# Patient Record
Sex: Female | Born: 1965 | Hispanic: No | Marital: Married | Smoking: Never smoker
Health system: Southern US, Community
[De-identification: ages and names within clinical notes are randomized; demographics above are authoritative.]

## PROBLEM LIST (undated history)

## (undated) ENCOUNTER — Ambulatory Visit: Source: Home / Self Care

## (undated) DIAGNOSIS — I10 Essential (primary) hypertension: Secondary | ICD-10-CM

## (undated) DIAGNOSIS — T7840XA Allergy, unspecified, initial encounter: Secondary | ICD-10-CM

## (undated) HISTORY — PX: ABDOMINAL HYSTERECTOMY: SHX81

## (undated) HISTORY — DX: Allergy, unspecified, initial encounter: T78.40XA

---

## 2009-07-12 ENCOUNTER — Encounter (INDEPENDENT_AMBULATORY_CARE_PROVIDER_SITE_OTHER): Payer: Self-pay | Admitting: Obstetrics & Gynecology

## 2009-07-12 ENCOUNTER — Inpatient Hospital Stay (HOSPITAL_COMMUNITY): Admission: RE | Admit: 2009-07-12 | Discharge: 2009-07-14 | Payer: Self-pay | Admitting: Obstetrics & Gynecology

## 2009-12-09 ENCOUNTER — Emergency Department (HOSPITAL_COMMUNITY): Admission: EM | Admit: 2009-12-09 | Discharge: 2009-12-10 | Payer: Self-pay | Admitting: Emergency Medicine

## 2010-06-27 LAB — CBC
Hemoglobin: 10.4 g/dL — ABNORMAL LOW (ref 12.0–15.0)
Hemoglobin: 13.1 g/dL (ref 12.0–15.0)
MCHC: 32.9 g/dL (ref 30.0–36.0)
MCV: 87.1 fL (ref 78.0–100.0)
RBC: 3.64 MIL/uL — ABNORMAL LOW (ref 3.87–5.11)
RBC: 4.59 MIL/uL (ref 3.87–5.11)
WBC: 7.1 10*3/uL (ref 4.0–10.5)

## 2010-06-27 LAB — PREGNANCY, URINE: Preg Test, Ur: NEGATIVE

## 2010-10-14 ENCOUNTER — Emergency Department (HOSPITAL_COMMUNITY)
Admission: EM | Admit: 2010-10-14 | Discharge: 2010-10-14 | Disposition: A | Discharge status: Home / Self Care | Payer: Federal, State, Local not specified - PPO | Attending: Emergency Medicine | Admitting: Emergency Medicine

## 2010-10-14 DIAGNOSIS — R220 Localized swelling, mass and lump, head: Secondary | ICD-10-CM | POA: Insufficient documentation

## 2010-10-14 DIAGNOSIS — X58XXXA Exposure to other specified factors, initial encounter: Secondary | ICD-10-CM | POA: Insufficient documentation

## 2010-10-14 DIAGNOSIS — T781XXA Other adverse food reactions, not elsewhere classified, initial encounter: Secondary | ICD-10-CM | POA: Insufficient documentation

## 2013-12-24 LAB — TSH
TSH: 2.64 u[IU]/mL (ref 0.41–5.90)
TSH: 2.64 u[IU]/mL (ref 0.41–5.90)

## 2014-01-07 ENCOUNTER — Ambulatory Visit: Payer: Federal, State, Local not specified - PPO | Admitting: Internal Medicine

## 2014-01-07 DIAGNOSIS — Z0289 Encounter for other administrative examinations: Secondary | ICD-10-CM

## 2014-01-14 ENCOUNTER — Ambulatory Visit: Payer: Federal, State, Local not specified - PPO | Admitting: Internal Medicine

## 2014-03-02 ENCOUNTER — Ambulatory Visit: Payer: Federal, State, Local not specified - PPO | Admitting: Internal Medicine

## 2014-06-17 ENCOUNTER — Emergency Department (HOSPITAL_COMMUNITY)
Admission: EM | Admit: 2014-06-17 | Discharge: 2014-06-17 | Disposition: A | Discharge status: Home / Self Care | Payer: Federal, State, Local not specified - PPO | Attending: Emergency Medicine | Admitting: Emergency Medicine

## 2014-06-17 ENCOUNTER — Encounter (HOSPITAL_COMMUNITY): Payer: Self-pay | Admitting: Emergency Medicine

## 2014-06-17 DIAGNOSIS — Y9389 Activity, other specified: Secondary | ICD-10-CM | POA: Diagnosis not present

## 2014-06-17 DIAGNOSIS — R51 Headache: Secondary | ICD-10-CM

## 2014-06-17 DIAGNOSIS — I10 Essential (primary) hypertension: Secondary | ICD-10-CM | POA: Diagnosis not present

## 2014-06-17 DIAGNOSIS — S0990XA Unspecified injury of head, initial encounter: Secondary | ICD-10-CM | POA: Insufficient documentation

## 2014-06-17 DIAGNOSIS — R519 Headache, unspecified: Secondary | ICD-10-CM

## 2014-06-17 DIAGNOSIS — S79922A Unspecified injury of left thigh, initial encounter: Secondary | ICD-10-CM | POA: Diagnosis not present

## 2014-06-17 DIAGNOSIS — S4992XA Unspecified injury of left shoulder and upper arm, initial encounter: Secondary | ICD-10-CM | POA: Insufficient documentation

## 2014-06-17 DIAGNOSIS — Y92410 Unspecified street and highway as the place of occurrence of the external cause: Secondary | ICD-10-CM | POA: Diagnosis not present

## 2014-06-17 DIAGNOSIS — Y998 Other external cause status: Secondary | ICD-10-CM | POA: Insufficient documentation

## 2014-06-17 HISTORY — DX: Essential (primary) hypertension: I10

## 2014-06-17 MED ORDER — IBUPROFEN 800 MG PO TABS
800.0000 mg | ORAL_TABLET | Freq: Once | ORAL | Status: AC
  Administered 2014-06-17: 800 mg via ORAL
  Filled 2014-06-17: qty 1

## 2014-06-17 MED ORDER — CYCLOBENZAPRINE HCL 10 MG PO TABS: 10.0000 mg | ORAL_TABLET | Freq: Three times a day (TID) | ORAL | Status: DC | PRN

## 2014-06-17 MED ORDER — AMLODIPINE BESYLATE 5 MG PO TABS: 5.0000 mg | ORAL_TABLET | Freq: Every day | ORAL | Status: DC

## 2014-06-17 MED ORDER — HYDROCODONE-ACETAMINOPHEN 5-325 MG PO TABS: 1.0000 | ORAL_TABLET | Freq: Four times a day (QID) | ORAL | Status: DC | PRN
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
## 2014-06-24 ENCOUNTER — Encounter: Payer: Self-pay | Admitting: Family

## 2014-06-24 ENCOUNTER — Ambulatory Visit (INDEPENDENT_AMBULATORY_CARE_PROVIDER_SITE_OTHER): Payer: Federal, State, Local not specified - PPO | Admitting: Family

## 2014-06-24 VITALS — BP 138/94 | HR 88 | Temp 98.4°F | Resp 18 | Ht 63.0 in | Wt 215.0 lb

## 2014-06-24 DIAGNOSIS — E1159 Type 2 diabetes mellitus with other circulatory complications: Secondary | ICD-10-CM | POA: Insufficient documentation

## 2014-06-24 DIAGNOSIS — I152 Hypertension secondary to endocrine disorders: Secondary | ICD-10-CM | POA: Insufficient documentation

## 2014-06-24 DIAGNOSIS — I10 Essential (primary) hypertension: Secondary | ICD-10-CM | POA: Insufficient documentation

## 2014-06-24 MED ORDER — AMLODIPINE BESYLATE 10 MG PO TABS: 10.0000 mg | ORAL_TABLET | Freq: Every day | ORAL | Status: DC

## 2014-06-24 MED ORDER — HYDROCHLOROTHIAZIDE 12.5 MG PO CAPS
12.5000 mg | ORAL_CAPSULE | Freq: Every day | ORAL | Status: DC
Start: 2014-06-24 — End: 2014-08-22

## 2014-06-24 NOTE — Patient Instructions (Addendum)
Thank you for choosing [facility].  Summary/Instructions:  Your prescription(s) have been submitted to your pharmacy or been printed and provided for you. Please take as directed and contact our office if you believe you are having problem(s) with the medication(s) or have any questions.  If your symptoms worsen or fail to improve, please contact our office for further instruction, or in case of emergency go directly to the emergency room at the closest medical facility.   Hypertension Hypertension, commonly called high blood pressure, is when the force of blood pumping through your arteries is too strong. Your arteries are the blood vessels that carry blood from your heart throughout your body. A blood pressure reading consists of a higher number over a lower number, such as 110/72. The higher number (systolic) is the pressure inside your arteries when your heart pumps. The lower number (diastolic) is the pressure inside your arteries when your heart relaxes. Ideally you want your blood pressure below 120/80. Hypertension forces your heart to work harder to pump blood. Your arteries may become narrow or stiff. Having hypertension puts you at risk for heart disease, stroke, and other problems.  RISK FACTORS Some risk factors for high blood pressure are controllable. Others are not.  Risk factors you cannot control include:   Race. You may be at higher risk if you are African American.  Age. Risk increases with age.  Gender. Men are at higher risk than women before age 45 years. After age 65, women are at higher risk than men. Risk factors you can control include:  Not getting enough exercise or physical activity.  Being overweight.  Getting too much fat, sugar, calories, or salt in your diet.  Drinking too much alcohol. SIGNS AND SYMPTOMS Hypertension does not usually cause signs or symptoms. Extremely high blood pressure (hypertensive crisis) may cause headache, anxiety,  shortness of breath, and nosebleed. DIAGNOSIS  To check if you have hypertension, your health care provider will measure your blood pressure while you are seated, with your arm held at the level of your heart. It should be measured at least twice using the same arm. Certain conditions can cause a difference in blood pressure between your right and left arms. A blood pressure reading that is higher than normal on one occasion does not mean that you need treatment. If one blood pressure reading is high, ask your health care provider about having it checked again. TREATMENT  Treating high blood pressure includes making lifestyle changes and possibly taking medicine. Living a healthy lifestyle can help lower high blood pressure. You may need to change some of your habits. Lifestyle changes may include:  Following the DASH diet. This diet is high in fruits, vegetables, and whole grains. It is low in salt, red meat, and added sugars.  Getting at least 2 hours of brisk physical activity every week.  Losing weight if necessary.  Not smoking.  Limiting alcoholic beverages.  Learning ways to reduce stress. If lifestyle changes are not enough to get your blood pressure under control, your health care provider may prescribe medicine. You may need to take more than one. Work closely with your health care provider to understand the risks and benefits. HOME CARE INSTRUCTIONS  Have your blood pressure rechecked as directed by your health care provider.   Take medicines only as directed by your health care provider. Follow the directions carefully. Blood pressure medicines must be taken as prescribed. The medicine does not work as well when you skip doses.   Skipping doses also puts you at risk for problems.   Do not smoke.   Monitor your blood pressure at home as directed by your health care provider. SEEK MEDICAL CARE IF:   You think you are having a reaction to medicines taken.  You have  recurrent headaches or feel dizzy.  You have swelling in your ankles.  You have trouble with your vision. SEEK IMMEDIATE MEDICAL CARE IF:  You develop a severe headache or confusion.  You have unusual weakness, numbness, or feel faint.  You have severe chest or abdominal pain.  You vomit repeatedly.  You have trouble breathing. MAKE SURE YOU:   Understand these instructions.  Will watch your condition.  Will get help right away if you are not doing well or get worse. Document Released: 03/25/2005 Document Revised: 08/09/2013 Document Reviewed: 01/15/2013 ExitCare Patient Information 2015 [employer]. This information is not intended to replace advice given to you by your health care provider. Make sure you discuss any questions you have with your health care provider.   

## 2014-06-24 NOTE — Progress Notes (Signed)
Subjective:    Patient ID: Bailey Ward, female    DOB: 10/22/1965, 49 y.o.   MRN: [id number]  Chief Complaint  Patient presents with  . Establish Care    wants to talk about BP medication    HPI:  Bailey Ward is a 49 y.o. female who presents today to establish care and discuss her blood pressure medication.     1)  Blood pressure - currently maintained on amlodipine. Notes that she was having the associated symptom of a headache and was found to have a blood pressure at work of 197/96.  Notes that she was given a 30 day supply and was taking 2 amlodipine when she was prescribed 5 mg daily. She is almost out of her current medication.    BP Readings from Last 3 Encounters:  06/24/14 138/94  06/17/14 149/98    No Known Allergies  Current Outpatient Prescriptions on File Prior to Visit  Medication Sig Dispense Refill  . cetirizine (ZYRTEC) 10 MG tablet Take 10 mg by mouth daily as needed for allergies (only in allergy season).    . cyclobenzaprine (FLEXERIL) 10 MG tablet Take 1 tablet (10 mg total) by mouth 3 (three) times daily as needed for muscle spasms. 6 tablet 0  . HYDROcodone-acetaminophen (NORCO) 5-325 MG per tablet Take 1 tablet by mouth every 6 (six) hours as needed. 10 tablet 0  . ibuprofen (ADVIL,MOTRIN) 200 MG tablet Take 400 mg by mouth every 6 (six) hours as needed for headache or moderate pain.     No current facility-administered medications on file prior to visit.    Past Medical History  Diagnosis Date  . Hypertension     Past Surgical History  Procedure Laterality Date  . Abdominal hysterectomy      Family History  Problem Relation Age of Onset  . Hypertension Mother   . Diabetes Father   . Hypertension Father     History   Social History  . Marital Status: Married    Spouse Name: N/A  . Number of Children: 2  . Years of Education: 18   Occupational History  . Annuity Specialist    Social History Main Topics  .  Smoking status: Never Smoker   . Smokeless tobacco: Never Used  . Alcohol Use: No  . Drug Use: No  . Sexual Activity: Not on file   Other Topics Concern  . Not on file   Social History Narrative   [place] and raised in [place]. Fun: [place], watch her daughter play soccer, watch movies   Denies religious beliefs that would effect health care.      Review of Systems  Eyes:       Negative for changes in vision.  Respiratory: Negative for chest tightness.   Cardiovascular: Negative for chest pain, palpitations and leg swelling.  Neurological: Negative for headaches.      Objective:    BP 138/94 mmHg  Pulse 88  Temp(Src) 98.4 F (36.9 C) (Oral)  Resp 18  Ht 5\' 3"  (1.6 m)  Wt 215 lb (97.523 kg)  BMI 38.09 kg/m2  SpO2 98% Nursing note and vital signs reviewed.  Physical Exam  Constitutional: She is oriented to person, place, and time. She appears well-developed and well-nourished. No distress.  Cardiovascular: Normal rate, regular rhythm, normal heart sounds and intact distal pulses.   Mild edema of bilateral lower extremities noted.   Pulmonary/Chest: Effort normal and breath sounds normal.  Neurological: She is alert and oriented to person, place, and time.  Skin: Skin is warm and dry.  Psychiatric: She has a normal mood and affect. Her behavior is normal. Judgment and thought content normal.       Assessment & Plan:

## 2014-06-24 NOTE — Progress Notes (Signed)
Pre visit review using our clinic review tool, if applicable. No additional management support is needed unless otherwise documented below in the visit note. 

## 2014-06-24 NOTE — Assessment & Plan Note (Signed)
Patient's blood pressure is greater than goal of 140/90. Increase amlodipine to 10 mg daily. Start hydrochlorothiazide daily for lower leg edema. Patient will schedule her eye exam independently. Follow-up in one month.

## 2014-07-05 ENCOUNTER — Ambulatory Visit: Payer: Federal, State, Local not specified - PPO | Admitting: Family

## 2014-07-11 ENCOUNTER — Ambulatory Visit: Payer: Federal, State, Local not specified - PPO | Admitting: Family

## 2014-08-05 ENCOUNTER — Ambulatory Visit: Payer: Federal, State, Local not specified - PPO | Admitting: Family

## 2014-08-08 ENCOUNTER — Ambulatory Visit: Payer: Federal, State, Local not specified - PPO | Admitting: Family

## 2014-08-22 ENCOUNTER — Telehealth: Payer: Self-pay | Admitting: Family

## 2014-08-22 MED ORDER — AMLODIPINE BESYLATE 10 MG PO TABS: 10.0000 mg | ORAL_TABLET | Freq: Every day | ORAL | Status: DC

## 2014-08-22 MED ORDER — HYDROCHLOROTHIAZIDE 12.5 MG PO CAPS: 12.5000 mg | ORAL_CAPSULE | Freq: Every day | ORAL | Status: DC

## 2014-08-22 NOTE — Telephone Encounter (Signed)
Patient is requesting refill for amLODipine (NORVASC) 10 MG tablet [[id number]][[id number]]  And hydrochlorothiazide (MICROZIDE) 12.5 MG capsule [[id number]][[id number]] . Pharmacy is [facility] on [address]

## 2014-08-22 NOTE — Telephone Encounter (Signed)
Rx's sent. Pt aware

## 2014-09-08 ENCOUNTER — Ambulatory Visit (INDEPENDENT_AMBULATORY_CARE_PROVIDER_SITE_OTHER): Payer: Federal, State, Local not specified - PPO | Admitting: Family

## 2014-09-08 ENCOUNTER — Encounter: Payer: Self-pay | Admitting: Family

## 2014-09-08 VITALS — BP 122/80 | HR 88 | Temp 98.1°F | Resp 18 | Ht 63.0 in | Wt 220.8 lb

## 2014-09-08 DIAGNOSIS — I10 Essential (primary) hypertension: Secondary | ICD-10-CM | POA: Diagnosis not present

## 2014-09-08 DIAGNOSIS — L659 Nonscarring hair loss, unspecified: Secondary | ICD-10-CM | POA: Diagnosis not present

## 2014-09-08 MED ORDER — FUROSEMIDE 20 MG PO TABS: 20.0000 mg | ORAL_TABLET | Freq: Every day | ORAL | Status: DC

## 2014-09-08 NOTE — Patient Instructions (Addendum)
Thank you for choosing [facility].  Summary/Instructions:  Your prescription(s) have been submitted to your pharmacy or been printed and provided for you. Please take as directed and contact our office if you believe you are having problem(s) with the medication(s) or have any questions.  If your symptoms worsen or fail to improve, please contact our office for further instruction, or in case of emergency go directly to the emergency room at the closest medical facility.    Please stop taking the amlodipine and HCTZ. START taking furosemide daily.  Call in 2 weeks with your blood pressure.

## 2014-09-08 NOTE — Assessment & Plan Note (Signed)
Stable with current regimen and blood pressure average below 140/90. Does not continued lower extremity edema. Discontinue amlodipine and HCTZ. Start Lasix. Follow up via phone in 2 weeks and if blood pressure elevates, consider addition of metoprolol for blood pressure control.

## 2014-09-08 NOTE — Progress Notes (Signed)
Subjective:    Patient ID: Bailey Ward, female    DOB: [birth year], 49 y.o.   MRN: [id number]  Chief Complaint  Patient presents with  . Follow-up    says the BP has been up and down but for the most part stable, says the HCTZ is not helping with swelling    HPI:  Bailey Ward is a 49 y.o. female with a PMH of hypertension who presents today for an office follow-up.   1.) Hypertension - Indicates her blood pressure is stable with the current medication and has readings that are averaging below 140/90. Currently taking HCTZ and amlodipine. Takes her medication as prescribed, however still notes lower extremity edema which is improved with leg elevation.   BP Readings from Last 3 Encounters:  09/08/14 122/80  06/24/14 138/94  06/17/14 149/98    2.) Hair loss - Indicates that since her hysterectomy she has noticed increased hair loss on the top of her head. Modifying factors include biotin and Vitamin B which has not helped very much.   No Known Allergies  Current Outpatient Prescriptions on File Prior to Visit  Medication Sig Dispense Refill  . cetirizine (ZYRTEC) 10 MG tablet Take 10 mg by mouth daily as needed for allergies (only in allergy season).    Marland Kitchen ibuprofen (ADVIL,MOTRIN) 200 MG tablet Take 400 mg by mouth every 6 (six) hours as needed for headache or moderate pain.     No current facility-administered medications on file prior to visit.    Review of Systems  Eyes:       Negative for changes in vision.  Respiratory: Negative for chest tightness.   Cardiovascular: Positive for leg swelling. Negative for chest pain and palpitations.  Skin:       Positive for hair loss on the top of her head      Objective:    BP 122/80 mmHg  Pulse 88  Temp(Src) 98.1 F (36.7 C) (Oral)  Resp 18  Ht  (1.6 m)  Wt 220 lb 12.8 oz (100.154 kg)  BMI 39.12 kg/m2  SpO2 94% Nursing note and vital signs reviewed.  Physical Exam  Constitutional: She is  oriented to person, place, and time. She appears well-developed and well-nourished. No distress.  Cardiovascular: Normal rate, regular rhythm, normal heart sounds and intact distal pulses.   Pulmonary/Chest: Effort normal and breath sounds normal.  Neurological: She is alert and oriented to person, place, and time.  Skin: Skin is warm and dry.  Psychiatric: She has a normal mood and affect. Her behavior is normal. Judgment and thought content normal.       Assessment & Plan:   Problem List Items Addressed This Visit      Cardiovascular and Mediastinum   Essential hypertension - Primary    Stable with current regimen and blood pressure average below 140/90. Does not continued lower extremity edema. Discontinue amlodipine and HCTZ. Start Lasix. Follow up via phone in 2 weeks and if blood pressure elevates, consider addition of metoprolol for blood pressure control.      Relevant Medications   furosemide (LASIX) 20 MG tablet     Musculoskeletal and Integument   Hair loss    Indicates that she has mild hair loss on the top of her head only since her hysterectomy that is refractory to biotin and vitamin B supplementation. Obtain TSH and iron to rule out metabolic causes, cannot rule out hormonal causes. Follow up pending lab work.  Relevant Orders   TSH   IBC panel

## 2014-09-08 NOTE — Assessment & Plan Note (Signed)
Indicates that she has mild hair loss on the top of her head only since her hysterectomy that is refractory to biotin and vitamin B supplementation. Obtain TSH and iron to rule out metabolic causes, cannot rule out hormonal causes. Follow up pending lab work.

## 2014-09-08 NOTE — Progress Notes (Signed)
Pre visit review using our clinic review tool, if applicable. No additional management support is needed unless otherwise documented below in the visit note. 

## 2015-05-25 ENCOUNTER — Encounter: Payer: Self-pay | Admitting: Family

## 2015-06-26 ENCOUNTER — Encounter: Payer: Federal, State, Local not specified - PPO | Admitting: Family

## 2015-08-01 ENCOUNTER — Encounter: Payer: Self-pay | Admitting: Family

## 2015-08-01 ENCOUNTER — Ambulatory Visit (INDEPENDENT_AMBULATORY_CARE_PROVIDER_SITE_OTHER): Payer: Federal, State, Local not specified - PPO | Admitting: Family

## 2015-08-01 VITALS — BP 158/100 | HR 94 | Temp 98.5°F | Resp 16 | Ht 63.0 in | Wt 217.0 lb

## 2015-08-01 DIAGNOSIS — E669 Obesity, unspecified: Secondary | ICD-10-CM

## 2015-08-01 DIAGNOSIS — Z0001 Encounter for general adult medical examination with abnormal findings: Secondary | ICD-10-CM | POA: Insufficient documentation

## 2015-08-01 DIAGNOSIS — I10 Essential (primary) hypertension: Secondary | ICD-10-CM | POA: Diagnosis not present

## 2015-08-01 DIAGNOSIS — R6889 Other general symptoms and signs: Secondary | ICD-10-CM | POA: Diagnosis not present

## 2015-08-01 MED ORDER — FUROSEMIDE 40 MG PO TABS: 40.0000 mg | ORAL_TABLET | Freq: Every day | ORAL | Status: DC

## 2015-08-01 NOTE — Patient Instructions (Signed)
Thank you for choosing [facility].  Summary/Instructions:  Please stop by the lab on the basement level of the building for your blood work. Your results will be released to MyChart (or called to you) after review, usually within 72 hours after test completion. If any changes need to be made, you will be notified at that same time.  If your symptoms worsen or fail to improve, please contact our office for further instruction, or in case of emergency go directly to the emergency room at the closest medical facility.   Health Maintenance, Female Adopting a healthy lifestyle and getting preventive care can go a long way to promote health and wellness. Talk with your health care provider about what schedule of regular examinations is right for you. This is a good chance for you to check in with your provider about disease prevention and staying healthy. In between checkups, there are plenty of things you can do on your own. Experts have done a lot of research about which lifestyle changes and preventive measures are most likely to keep you healthy. Ask your health care provider for more information. WEIGHT AND DIET  Eat a healthy diet  Be sure to include plenty of vegetables, fruits, low-fat dairy products, and lean protein.  Do not eat a lot of foods high in solid fats, added sugars, or salt.  Get regular exercise. This is one of the most important things you can do for your health.  Most adults should exercise for at least 150 minutes each week. The exercise should increase your heart rate and make you sweat (moderate-intensity exercise).  Most adults should also do strengthening exercises at least twice a week. This is in addition to the moderate-intensity exercise.  Maintain a healthy weight  Body mass index (BMI) is a measurement that can be used to identify possible weight problems. It estimates body fat based on height and weight. Your health care provider can help determine your  BMI and help you achieve or maintain a healthy weight.  For females 20 years of age and older:   A BMI below 18.5 is considered underweight.  A BMI of 18.5 to 24.9 is normal.  A BMI of 25 to 29.9 is considered overweight.  A BMI of 30 and above is considered obese.  Watch levels of cholesterol and blood lipids  You should start having your blood tested for lipids and cholesterol at 50 years of age, then have this test every 5 years.  You may need to have your cholesterol levels checked more often if:  Your lipid or cholesterol levels are high.  You are older than 50 years of age.  You are at high risk for heart disease.  CANCER SCREENING   Lung Cancer  Lung cancer screening is recommended for adults 55-80 years old who are at high risk for lung cancer because of a history of smoking.  A yearly low-dose CT scan of the lungs is recommended for people who:  Currently smoke.  Have quit within the past 15 years.  Have at least a 30-pack-year history of smoking. A pack year is smoking an average of one pack of cigarettes a day for 1 year.  Yearly screening should continue until it has been 15 years since you quit.  Yearly screening should stop if you develop a health problem that would prevent you from having lung cancer treatment.  Breast Cancer  Practice breast self-awareness. This means understanding how your breasts normally appear and feel.  It   also means doing regular breast self-exams. Let your health care provider know about any changes, no matter how small.  If you are in your 20s or 30s, you should have a clinical breast exam (CBE) by a health care provider every 1-3 years as part of a regular health exam.  If you are 40 or older, have a CBE every year. Also consider having a breast X-ray (mammogram) every year.  If you have a family history of breast cancer, talk to your health care provider about genetic screening.  If you are at high risk for breast  cancer, talk to your health care provider about having an MRI and a mammogram every year.  Breast cancer gene (BRCA) assessment is recommended for women who have family members with BRCA-related cancers. BRCA-related cancers include:  Breast.  Ovarian.  Tubal.  Peritoneal cancers.  Results of the assessment will determine the need for genetic counseling and BRCA1 and BRCA2 testing. Cervical Cancer Your health care provider may recommend that you be screened regularly for cancer of the pelvic organs (ovaries, uterus, and vagina). This screening involves a pelvic examination, including checking for microscopic changes to the surface of your cervix (Pap test). You may be encouraged to have this screening done every 3 years, beginning at age 21.  For women ages 30-65, health care providers may recommend pelvic exams and Pap testing every 3 years, or they may recommend the Pap and pelvic exam, combined with testing for human papilloma virus (HPV), every 5 years. Some types of HPV increase your risk of cervical cancer. Testing for HPV may also be done on women of any age with unclear Pap test results.  Other health care providers may not recommend any screening for nonpregnant women who are considered low risk for pelvic cancer and who do not have symptoms. Ask your health care provider if a screening pelvic exam is right for you.  If you have had past treatment for cervical cancer or a condition that could lead to cancer, you need Pap tests and screening for cancer for at least 20 years after your treatment. If Pap tests have been discontinued, your risk factors (such as having a new sexual partner) need to be reassessed to determine if screening should resume. Some women have medical problems that increase the chance of getting cervical cancer. In these cases, your health care provider may recommend more frequent screening and Pap tests. Colorectal Cancer  This type of cancer can be detected and  often prevented.  Routine colorectal cancer screening usually begins at 50 years of age and continues through 50 years of age.  Your health care provider may recommend screening at an earlier age if you have risk factors for colon cancer.  Your health care provider may also recommend using home test kits to check for hidden blood in the stool.  A small camera at the end of a tube can be used to examine your colon directly (sigmoidoscopy or colonoscopy). This is done to check for the earliest forms of colorectal cancer.  Routine screening usually begins at age 50.  Direct examination of the colon should be repeated every 5-10 years through 50 years of age. However, you may need to be screened more often if early forms of precancerous polyps or small growths are found. Skin Cancer  Check your skin from head to toe regularly.  Tell your health care provider about any new moles or changes in moles, especially if there is a change in a mole's   shape or color.  Also tell your health care provider if you have a mole that is larger than the size of a pencil eraser.  Always use sunscreen. Apply sunscreen liberally and repeatedly throughout the day.  Protect yourself by wearing long sleeves, pants, a wide-brimmed hat, and sunglasses whenever you are outside. HEART DISEASE, DIABETES, AND HIGH BLOOD PRESSURE   High blood pressure causes heart disease and increases the risk of stroke. High blood pressure is more likely to develop in:  People who have blood pressure in the high end of the normal range (130-139/85-89 mm Hg).  People who are overweight or obese.  People who are African American.  If you are 18-39 years of age, have your blood pressure checked every 3-5 years. If you are 40 years of age or older, have your blood pressure checked every year. You should have your blood pressure measured twice--once when you are at a hospital or clinic, and once when you are not at a hospital or clinic.  Record the average of the two measurements. To check your blood pressure when you are not at a hospital or clinic, you can use:  An automated blood pressure machine at a pharmacy.  A home blood pressure monitor.  If you are between 55 years and 79 years old, ask your health care provider if you should take aspirin to prevent strokes.  Have regular diabetes screenings. This involves taking a blood sample to check your fasting blood sugar level.  If you are at a normal weight and have a low risk for diabetes, have this test once every three years after 50 years of age.  If you are overweight and have a high risk for diabetes, consider being tested at a younger age or more often. PREVENTING INFECTION  Hepatitis B  If you have a higher risk for hepatitis B, you should be screened for this virus. You are considered at high risk for hepatitis B if:  You were born in a country where hepatitis B is common. Ask your health care provider which countries are considered high risk.  Your parents were born in a high-risk country, and you have not been immunized against hepatitis B (hepatitis B vaccine).  You have HIV or AIDS.  You use needles to inject street drugs.  You live with someone who has hepatitis B.  You have had sex with someone who has hepatitis B.  You get hemodialysis treatment.  You take certain medicines for conditions, including cancer, organ transplantation, and autoimmune conditions. Hepatitis C  Blood testing is recommended for:  Everyone born from 1945 through 1965.  Anyone with known risk factors for hepatitis C. Sexually transmitted infections (STIs)  You should be screened for sexually transmitted infections (STIs) including gonorrhea and chlamydia if:  You are sexually active and are younger than 50 years of age.  You are older than 50 years of age and your health care provider tells you that you are at risk for this type of infection.  Your sexual activity  has changed since you were last screened and you are at an increased risk for chlamydia or gonorrhea. Ask your health care provider if you are at risk.  If you do not have HIV, but are at risk, it may be recommended that you take a prescription medicine daily to prevent HIV infection. This is called pre-exposure prophylaxis (PrEP). You are considered at risk if:  You are sexually active and do not regularly use condoms or know the   HIV status of your partner(s).  You take drugs by injection.  You are sexually active with a partner who has HIV. Talk with your health care provider about whether you are at high risk of being infected with HIV. If you choose to begin PrEP, you should first be tested for HIV. You should then be tested every 3 months for as long as you are taking PrEP.  PREGNANCY   If you are premenopausal and you may become pregnant, ask your health care provider about preconception counseling.  If you may become pregnant, take 400 to 800 micrograms (mcg) of folic acid every day.  If you want to prevent pregnancy, talk to your health care provider about birth control (contraception). OSTEOPOROSIS AND MENOPAUSE   Osteoporosis is a disease in which the bones lose minerals and strength with aging. This can result in serious bone fractures. Your risk for osteoporosis can be identified using a bone density scan.  If you are 65 years of age or older, or if you are at risk for osteoporosis and fractures, ask your health care provider if you should be screened.  Ask your health care provider whether you should take a calcium or vitamin D supplement to lower your risk for osteoporosis.  Menopause may have certain physical symptoms and risks.  Hormone replacement therapy may reduce some of these symptoms and risks. Talk to your health care provider about whether hormone replacement therapy is right for you.  HOME CARE INSTRUCTIONS   Schedule regular health, dental, and eye  exams.  Stay current with your immunizations.   Do not use any tobacco products including cigarettes, chewing tobacco, or electronic cigarettes.  If you are pregnant, do not drink alcohol.  If you are breastfeeding, limit how much and how often you drink alcohol.  Limit alcohol intake to no more than 1 drink per day for nonpregnant women. One drink equals 12 ounces of beer, 5 ounces of wine, or 1 ounces of hard liquor.  Do not use street drugs.  Do not share needles.  Ask your health care provider for help if you need support or information about quitting drugs.  Tell your health care provider if you often feel depressed.  Tell your health care provider if you have ever been abused or do not feel safe at home.   This information is not intended to replace advice given to you by your health care provider. Make sure you discuss any questions you have with your health care provider.   Document Released: 10/08/2010 Document Revised: 04/15/2014 Document Reviewed: 02/24/2013 Elsevier Interactive Patient Education 2016 [employer] Inc[employer]   

## 2015-08-01 NOTE — Assessment & Plan Note (Signed)
Hypertension is uncontrolled and above goal 140/90 on lifestyle management. No symptoms of end organ damage with mild lower extremity edema noted. Start furosemide. Encouraged to monitor blood pressures at home. Follow-up in 3 weeks or sooner if needed.

## 2015-08-01 NOTE — Assessment & Plan Note (Signed)
BMI of 38 and overly sedentary lifestyle. Encouraged weight loss of 5-10% of current body weight through lifestyle changes. Recommend increasing physical activity to 30 minutes of moderate level activity daily. Encourage nutritional intake that focuses on nutrient dense foods and is moderate, varied, and balanced and is low in saturated fats and processed/sugary foods. Continue to monitor.

## 2015-08-01 NOTE — Progress Notes (Signed)
Pre visit review using our clinic review tool, if applicable. No additional management support is needed unless otherwise documented below in the visit note. 

## 2015-08-01 NOTE — Progress Notes (Signed)
Subjective:    Patient ID: Bailey Ward, female    DOB: 10/16/1965, 50 y.o.   MRN: [id number]  Chief Complaint  Patient presents with  . CPE    not fasting    HPI:  Bailey Ward is a 50 y.o. female who presents today for an annual wellness visit.   1) Health Maintenance -   Diet - Averages 3-4 meals per day consisting of chicken, fish, beef, pork, starches, fruits, and vegetables; Has decreased caffeine intake to about 2-3 cups daily  Exercise - No recent structured exercise; some walking   2) Preventative Exams / Immunizations:  Dental -- Due for exam  Vision -- Due for exam with appointment scheduled.    Health Maintenance  Topic Date Due  . HIV Screening  01/10/1981  . INFLUENZA VACCINE  11/07/2015  . PAP SMEAR  03/22/2018  . TETANUS/TDAP  08/10/2020     There is no immunization history on file for this patient.  No Known Allergies   Outpatient Prescriptions Prior to Visit  Medication Sig Dispense Refill  . cetirizine (ZYRTEC) 10 MG tablet Take 10 mg by mouth daily as needed for allergies (only in allergy season).    Marland Kitchen ibuprofen (ADVIL,MOTRIN) 200 MG tablet Take 400 mg by mouth every 6 (six) hours as needed for headache or moderate pain.    . furosemide (LASIX) 20 MG tablet Take 1 tablet (20 mg total) by mouth daily. 30 tablet 0   No facility-administered medications prior to visit.     Past Medical History  Diagnosis Date  . Hypertension   . Allergy      Past Surgical History  Procedure Laterality Date  . Abdominal hysterectomy       Family History  Problem Relation Age of Onset  . Hypertension Mother   . Diabetes Father   . Hypertension Father      Social History   Social History  . Marital Status: Married    Spouse Name: N/A  . Number of Children: 2  . Years of Education: 18   Occupational History  . Annuity Specialist    Social History Main Topics  . Smoking status: Never Smoker   . Smokeless tobacco:  Never Used  . Alcohol Use: No  . Drug Use: No  . Sexual Activity: Not on file   Other Topics Concern  . Not on file   Social History Narrative   Born [place], [place] and raised in [place]. Fun: Travel, go to [place], watch her daughter play soccer, watch movies   Denies religious beliefs that would effect health care.    Denies abuse and feels safe at home.      Review of Systems  Constitutional: Denies fever, chills, fatigue, or significant weight gain/loss. HENT: Head: Denies headache or neck pain Ears: Denies changes in hearing, ringing in ears, earache, drainage Nose: Denies discharge, stuffiness, itching, nosebleed, sinus pain Throat: Denies sore throat, hoarseness, dry mouth, sores, thrush Eyes: Denies loss/changes in vision, pain, redness, blurry/double vision, flashing lights Cardiovascular: Denies chest pain/discomfort, tightness, palpitations, shortness of breath with activity, difficulty lying down, swelling, sudden awakening with shortness of breath Respiratory: Denies shortness of breath, cough, sputum production, wheezing Gastrointestinal: Denies dysphasia, heartburn, change in appetite, nausea, change in bowel habits, rectal bleeding, constipation, diarrhea, yellow skin or eyes Genitourinary: Denies frequency, urgency, burning/pain, blood in urine, incontinence, change in urinary strength. Musculoskeletal: Denies muscle/joint pain, stiffness, back pain, redness or swelling of joints, trauma Skin: Denies  rashes, lumps, itching, dryness, color changes, or hair/nail changes Neurological: Denies dizziness, fainting, seizures, weakness, numbness, tingling, tremor Psychiatric - Denies nervousness, stress, depression or memory loss Endocrine: Denies heat or cold intolerance, sweating, frequent urination, excessive thirst, changes in appetite Hematologic: Denies ease of bruising or bleeding     Objective:     BP 158/100 mmHg  Pulse 94  Temp(Src) 98.5 F (36.9 C) (Oral)   Resp 16  Ht  (1.6 m)  Wt 217 lb (98.431 kg)  BMI 38.45 kg/m2  SpO2 97% Nursing note and vital signs reviewed.  Physical Exam  Constitutional: She is oriented to person, place, and time. She appears well-developed and well-nourished.  HENT:  Head: Normocephalic.  Right Ear: Hearing, tympanic membrane, external ear and ear canal normal.  Left Ear: Hearing, tympanic membrane, external ear and ear canal normal.  Nose: Nose normal.  Mouth/Throat: Uvula is midline, oropharynx is clear and moist and mucous membranes are normal.  Eyes: Conjunctivae and EOM are normal. Pupils are equal, round, and reactive to light.  Neck: Neck supple. No JVD present. No tracheal deviation present. No thyromegaly present.  Cardiovascular: Normal rate, regular rhythm, normal heart sounds and intact distal pulses.   Mild/moderate nonpitting edema noted bilaterally.  Pulmonary/Chest: Effort normal and breath sounds normal.  Abdominal: Soft. Bowel sounds are normal. She exhibits no distension and no mass. There is no tenderness. There is no rebound and no guarding.  Musculoskeletal: Normal range of motion. She exhibits no edema or tenderness.  Lymphadenopathy:    She has no cervical adenopathy.  Neurological: She is alert and oriented to person, place, and time. She has normal reflexes. No cranial nerve deficit. She exhibits normal muscle tone. Coordination normal.  Skin: Skin is warm and dry.  Psychiatric: She has a normal mood and affect. Her behavior is normal. Judgment and thought content normal.       Assessment & Plan:   Problem List Items Addressed This Visit      Cardiovascular and Mediastinum   Essential hypertension    Hypertension is uncontrolled and above goal 140/90 on lifestyle management. No symptoms of end organ damage with mild lower extremity edema noted. Start furosemide. Encouraged to monitor blood pressures at home. Follow-up in 3 weeks or sooner if needed.      Relevant  Medications   furosemide (LASIX) 40 MG tablet     Other   Encounter for general adult medical examination with abnormal findings - Primary    1) Anticipatory Guidance: Discussed importance of wearing a seatbelt while driving and not texting while driving; changing batteries in smoke detector at least once annually; wearing suntan lotion when outside; eating a balanced and moderate diet; getting physical activity at least 30 minutes per day.  2) Immunizations / Screenings / Labs:  All immunizations are up-to-date per recommendations. Obtain vitamin D for vitamin D deficiency screening. Due for a dental exam encouraged to be completed independently. Due for a vision exam which she has scheduled for 1 week from today. All other screenings are up-to-date per recommendations. Obtain CBC, CMET, Lipid profile and TSH.   Overall well exam with risk factors for cardiovascular disease including uncontrolled hypertension and obesity. C hypertension for hypertension plan. Recommended weight loss of 5-10% of current body weight through lifestyle changes. Recommend increasing physical activity to 30 minutes of moderate level activity daily. Encourage nutritional intake that focuses on nutrient dense foods and is moderate, varied, and balanced and is low in saturated fats and  processed/sugary foods. Continue other healthy lifestyle behaviors and choices. Follow-up prevention exam in 1 year. Follow-up office visit for chronic conditions in 3 weeks      Relevant Orders   CBC   Comprehensive metabolic panel   Lipid panel   TSH   Vitamin D (25 hydroxy)   Obesity    BMI of 38 and overly sedentary lifestyle. Encouraged weight loss of 5-10% of current body weight through lifestyle changes. Recommend increasing physical activity to 30 minutes of moderate level activity daily. Encourage nutritional intake that focuses on nutrient dense foods and is moderate, varied, and balanced and is low in saturated fats and  processed/sugary foods. Continue to monitor.

## 2015-08-01 NOTE — Assessment & Plan Note (Addendum)
1) Anticipatory Guidance: Discussed importance of wearing a seatbelt while driving and not texting while driving; changing batteries in smoke detector at least once annually; wearing suntan lotion when outside; eating a balanced and moderate diet; getting physical activity at least 30 minutes per day.  2) Immunizations / Screenings / Labs:  All immunizations are up-to-date per recommendations. Obtain vitamin D for vitamin D deficiency screening. Due for a dental exam encouraged to be completed independently. Due for a vision exam which she has scheduled for 1 week from today. All other screenings are up-to-date per recommendations. Obtain CBC, CMET, Lipid profile and TSH.   Overall well exam with risk factors for cardiovascular disease including uncontrolled hypertension and obesity. C hypertension for hypertension plan. Recommended weight loss of 5-10% of current body weight through lifestyle changes. Recommend increasing physical activity to 30 minutes of moderate level activity daily. Encourage nutritional intake that focuses on nutrient dense foods and is moderate, varied, and balanced and is low in saturated fats and processed/sugary foods. Continue other healthy lifestyle behaviors and choices. Follow-up prevention exam in 1 year. Follow-up office visit for chronic conditions in 3 weeks

## 2015-08-22 ENCOUNTER — Encounter: Payer: Self-pay | Admitting: Family

## 2015-08-22 ENCOUNTER — Ambulatory Visit (INDEPENDENT_AMBULATORY_CARE_PROVIDER_SITE_OTHER): Payer: Federal, State, Local not specified - PPO | Admitting: Family

## 2015-08-22 ENCOUNTER — Telehealth: Payer: Self-pay | Admitting: Family

## 2015-08-22 ENCOUNTER — Other Ambulatory Visit (INDEPENDENT_AMBULATORY_CARE_PROVIDER_SITE_OTHER): Payer: Federal, State, Local not specified - PPO

## 2015-08-22 VITALS — BP 142/98 | HR 83 | Temp 98.5°F | Resp 16 | Ht 63.0 in | Wt 213.0 lb

## 2015-08-22 DIAGNOSIS — I10 Essential (primary) hypertension: Secondary | ICD-10-CM | POA: Diagnosis not present

## 2015-08-22 DIAGNOSIS — L659 Nonscarring hair loss, unspecified: Secondary | ICD-10-CM | POA: Diagnosis not present

## 2015-08-22 DIAGNOSIS — Z0001 Encounter for general adult medical examination with abnormal findings: Secondary | ICD-10-CM

## 2015-08-22 DIAGNOSIS — Z0000 Encounter for general adult medical examination without abnormal findings: Secondary | ICD-10-CM

## 2015-08-22 DIAGNOSIS — Z5181 Encounter for therapeutic drug level monitoring: Secondary | ICD-10-CM

## 2015-08-22 DIAGNOSIS — E559 Vitamin D deficiency, unspecified: Secondary | ICD-10-CM

## 2015-08-22 LAB — COMPREHENSIVE METABOLIC PANEL
ALT: 7 U/L (ref 0–35)
AST: 12 U/L (ref 0–37)
Albumin: 4.2 g/dL (ref 3.5–5.2)
Alkaline Phosphatase: 71 U/L (ref 39–117)
BUN: 11 mg/dL (ref 6–23)
CHLORIDE: 103 meq/L (ref 96–112)
CO2: 26 meq/L (ref 19–32)
CREATININE: 1.06 mg/dL (ref 0.40–1.20)
Calcium: 9.4 mg/dL (ref 8.4–10.5)
GFR: 58.41 mL/min — ABNORMAL LOW (ref >60.00)
GLUCOSE: 128 mg/dL — AB (ref 70–99)
Potassium: 3.8 mEq/L (ref 3.5–5.1)
SODIUM: 138 meq/L (ref 135–145)
Total Bilirubin: 0.4 mg/dL (ref 0.2–1.2)
Total Protein: 7.8 g/dL (ref 6.0–8.3)

## 2015-08-22 LAB — CBC
HCT: 39.5 % (ref 36.0–46.0)
Hemoglobin: 13 g/dL (ref 12.0–15.0)
MCHC: 33 g/dL (ref 30.0–36.0)
MCV: 82.1 fl (ref 78.0–100.0)
Platelets: 397 10*3/uL (ref 150.0–400.0)
RBC: 4.81 Mil/uL (ref 3.87–5.11)
RDW: 14.7 % (ref 11.5–15.5)
WBC: 6 10*3/uL (ref 4.0–10.5)

## 2015-08-22 LAB — VITAMIN D 25 HYDROXY (VIT D DEFICIENCY, FRACTURES): VITD: 6.74 ng/mL — AB (ref 30.00–100.00)

## 2015-08-22 LAB — IBC PANEL
IRON: 52 ug/dL (ref 42–145)
Saturation Ratios: 14.4 % — ABNORMAL LOW (ref 20.0–50.0)
TRANSFERRIN: 258 mg/dL (ref 212.0–360.0)

## 2015-08-22 LAB — LIPID PANEL
CHOL/HDL RATIO: 6
Cholesterol: 182 mg/dL (ref 0–200)
HDL: 30.2 mg/dL — ABNORMAL LOW (ref >39.00)
LDL CALC: 114 mg/dL — AB (ref 0–99)
NonHDL: 151.74
Triglycerides: 188 mg/dL — ABNORMAL HIGH (ref 0.0–149.0)
VLDL: 37.6 mg/dL (ref 0.0–40.0)

## 2015-08-22 LAB — TSH: TSH: 1.98 u[IU]/mL (ref 0.35–4.50)

## 2015-08-22 MED ORDER — OLMESARTAN MEDOXOMIL 20 MG PO TABS: 20.0000 mg | ORAL_TABLET | Freq: Every day | ORAL | Status: DC

## 2015-08-22 MED ORDER — VITAMIN D3 1.25 MG (50000 UT) PO TABS: 50000.0000 [IU] | ORAL_TABLET | ORAL | Status: DC

## 2015-08-22 NOTE — Progress Notes (Signed)
Pre visit review using our clinic review tool, if applicable. No additional management support is needed unless otherwise documented below in the visit note. 

## 2015-08-22 NOTE — Assessment & Plan Note (Signed)
Blood pressure remains elevated above 140/90 with current regimen although much improved since previous office visit. No adverse side effects or hypotensive readings. No symptoms of end organ damage. Lower extremity edema has improved. Obtain basic metabolic profile to check potassium and kidney function. Start Benicar. Continue to monitor blood pressure at home. Continue to work on nutrition and physical activity. Follow-up in 3 weeks for nurse visit for blood pressure check.

## 2015-08-22 NOTE — Patient Instructions (Addendum)
Thank you for choosing [facility].  Summary/Instructions:  Continue to take the furosemide as prescribed.  Start the [employer]. Coupons are available at [email or web address]  If there is a less expensive alternative, please ask and we will switch.   Your prescription(s) have been submitted to your pharmacy or been printed and provided for you. Please take as directed and contact our office if you believe you are having problem(s) with the medication(s) or have any questions.  Please stop by the lab on the basement level of the building for your blood work. Your results will be released to MyChart (or called to you) after review, usually within 72 hours after test completion. If any changes need to be made, you will be notified at that same time.  If your symptoms worsen or fail to improve, please contact our office for further instruction, or in case of emergency go directly to the emergency room at the closest medical facility.

## 2015-08-22 NOTE — Telephone Encounter (Signed)
Please inform patient that her electrolytes and kidney function look good. Her other blood work was completed with her blood draw which shows that her Vitamin D is significantly low. I have sent a prescription for 6 weeks of high dose Vitamin D that I would like her to follow with a daily Vitamin D supplement for 2 months at 2000 units. Her thyroid function and white/red blood cells are within the normal ranges. Lastly her iron was slightly low which I would recommend ferrous sulfate 325 mg daily for the next 3 months.

## 2015-08-22 NOTE — Progress Notes (Signed)
Subjective:    Patient ID: Bailey Ward, female    DOB: 11/21/1965, 50 y.o.   MRN: [id number]  Chief Complaint  Patient presents with  . Follow-up    hypertension    HPI:  Bailey Ward is a 50 y.o. female who  has a past medical history of Hypertension and Allergy. and presents today for an office follow up.   1.) Hypertension  -  Previously started on furosemide. Reports taking the medication as prescribed and denies adverse side effects. Blood pressures at home remains elevated but overall improved. Denies hypotensive readings or symptoms of end organ damage. Continues to work on nutrition and physical activity having lost about 4 pounds.   BP Readings from Last 3 Encounters:  08/22/15 142/98  08/01/15 158/100  09/08/14 122/80    No Known Allergies   Current Outpatient Prescriptions on File Prior to Visit  Medication Sig Dispense Refill  . cetirizine (ZYRTEC) 10 MG tablet Take 10 mg by mouth daily as needed for allergies (only in allergy season).    . furosemide (LASIX) 40 MG tablet Take 1 tablet (40 mg total) by mouth daily. 30 tablet 3  . ibuprofen (ADVIL,MOTRIN) 200 MG tablet Take 400 mg by mouth every 6 (six) hours as needed for headache or moderate pain.     No current facility-administered medications on file prior to visit.    Review of Systems  Constitutional: Negative for fever and chills.  Eyes:       Negative for changes in vision  Respiratory: Negative for cough, chest tightness and wheezing.   Cardiovascular: Negative for chest pain, palpitations and leg swelling.  Neurological: Negative for dizziness, weakness and light-headedness.      Objective:    BP 142/98 mmHg  Pulse 83  Temp(Src) 98.5 F (36.9 C) (Oral)  Resp 16  Ht 5\' 3"  (1.6 m)  Wt 213 lb (96.616 kg)  BMI 37.74 kg/m2  SpO2 98% Nursing note and vital signs reviewed.  Physical Exam  Constitutional: She is oriented to person, place, and time. She appears well-developed  and well-nourished. No distress.  Cardiovascular: Normal rate, regular rhythm, normal heart sounds and intact distal pulses.   Pulmonary/Chest: Effort normal and breath sounds normal.  Neurological: She is alert and oriented to person, place, and time.  Skin: Skin is warm and dry.  Psychiatric: She has a normal mood and affect. Her behavior is normal. Judgment and thought content normal.       Assessment & Plan:   Problem List Items Addressed This Visit      Cardiovascular and Mediastinum   Essential hypertension - Primary    Blood pressure remains elevated above 140/90 with current regimen although much improved since previous office visit. No adverse side effects or hypotensive readings. No symptoms of end organ damage. Lower extremity edema has improved. Obtain basic metabolic profile to check potassium and kidney function. Start Benicar. Continue to monitor blood pressure at home. Continue to work on nutrition and physical activity. Follow-up in 3 weeks for nurse visit for blood pressure check.      Relevant Medications   olmesartan (BENICAR) 20 MG tablet   Other Relevant Orders   Basic Metabolic Panel (BMET)    Other Visit Diagnoses    Encounter for therapeutic drug monitoring        Relevant Orders    Basic Metabolic Panel (BMET)        I am having Ms. Ward maintain her ibuprofen, cetirizine, furosemide, and  olmesartan.   Meds ordered this encounter  Medications  . DISCONTD: olmesartan (BENICAR) 20 MG tablet    Sig: Take 1 tablet (20 mg total) by mouth daily.    Dispense:  30 tablet    Refill:  1    Order Specific Question:  Supervising Provider    Answer:  Hillard Danker A [[id number]]  . olmesartan (BENICAR) 20 MG tablet    Sig: Take 1 tablet (20 mg total) by mouth daily.    Dispense:  30 tablet    Refill:  1    Please do not fill until patient arrives with discount card.  Please discontinue previous orders for Benicar.    Order Specific Question:   Supervising Provider    Answer:  Hillard Danker A [[id number]]     Follow-up: Return in about 3 weeks (around 09/12/2015) for Nursing visit for blood pressure check.  Jeanine Luz, FNP

## 2015-08-24 NOTE — Telephone Encounter (Signed)
Pt aware of results 

## 2015-09-25 ENCOUNTER — Other Ambulatory Visit: Payer: Self-pay | Admitting: Family

## 2015-11-30 ENCOUNTER — Telehealth: Payer: Self-pay | Admitting: Emergency Medicine

## 2015-11-30 DIAGNOSIS — I10 Essential (primary) hypertension: Secondary | ICD-10-CM

## 2015-11-30 MED ORDER — OLMESARTAN MEDOXOMIL 20 MG PO TABS: 20.0000 mg | ORAL_TABLET | Freq: Every day | ORAL | 1 refills | Status: DC

## 2015-11-30 MED ORDER — FUROSEMIDE 40 MG PO TABS: 40.0000 mg | ORAL_TABLET | Freq: Every day | ORAL | 3 refills | Status: DC

## 2015-11-30 NOTE — Telephone Encounter (Signed)
Medications have been sent. Tried to call pt to let her know. No answer and VM was full.

## 2015-11-30 NOTE — Telephone Encounter (Signed)
Pt called and needs a prescription refill on furosemide (LASIX) 40 MG tablet and olmesartan (BENICAR) 20 MG tablet. Pharmacy is [facility]- [address]. Pt stated if she needs to make appointment then just let her know. Thanks.

## 2016-05-21 ENCOUNTER — Telehealth: Payer: Self-pay | Admitting: Family

## 2016-05-21 ENCOUNTER — Encounter: Payer: Self-pay | Admitting: Family Medicine

## 2016-05-21 ENCOUNTER — Ambulatory Visit (INDEPENDENT_AMBULATORY_CARE_PROVIDER_SITE_OTHER): Payer: Federal, State, Local not specified - PPO | Admitting: Family Medicine

## 2016-05-21 VITALS — BP 146/98 | HR 114 | Temp 100.4°F | Wt 212.8 lb

## 2016-05-21 DIAGNOSIS — R05 Cough: Secondary | ICD-10-CM

## 2016-05-21 DIAGNOSIS — R059 Cough, unspecified: Secondary | ICD-10-CM

## 2016-05-21 LAB — POC INFLUENZA A&B (BINAX/QUICKVUE)
Influenza A, POC: NEGATIVE
Influenza B, POC: NEGATIVE

## 2016-05-21 MED ORDER — AZITHROMYCIN 250 MG PO TABS: ORAL_TABLET | ORAL | 0 refills | Status: DC

## 2016-05-21 MED ORDER — HYDROCODONE-HOMATROPINE 5-1.5 MG/5ML PO SYRP: 5.0000 mL | ORAL_SOLUTION | Freq: Every evening | ORAL | 0 refills | Status: DC | PRN

## 2016-05-21 NOTE — Telephone Encounter (Signed)
Notified patient that prescription was printed off and given to them during the appointment. Patients husband verbalized understanding.

## 2016-05-21 NOTE — Progress Notes (Signed)
Pre visit review using our clinic review tool, if applicable. No additional management support is needed unless otherwise documented below in the visit note. 

## 2016-05-21 NOTE — Telephone Encounter (Signed)
Patient's husband calling because cough syrup was not called in to the pharmacy.  Thank you,  -LL

## 2016-05-21 NOTE — Progress Notes (Signed)
History of Present Illness:    Bailey Ward is a 51 y.o. female here for evaluation of a cough. The cough is productive of green/yellow sputum, with shortness of breath during the cough and is aggravated by reclining position. Onset of symptoms was 1 week ago, gradually worsening since that time.  Associated symptoms include fever. Patient does not have a history of asthma. Patient has not had recent travel. Patient does not have a history of smoking.   PMHx, SurgHx, SocialHx, Medications, and Allergies were reviewed in the Visit Navigator and updated as appropriate.   Past Medical History:  Diagnosis Date  . Allergy   . Hypertension    Past Surgical History:  Procedure Laterality Date  . ABDOMINAL HYSTERECTOMY        Family History  Problem Relation Age of Onset  . Hypertension Mother   . Diabetes Father   . Hypertension Father    Social History  Substance Use Topics  . Smoking status: Never Smoker  . Smokeless tobacco: Never Used  . Alcohol use No     No Known Allergies  Prior to Admission medications   Medication Sig Start Date End Date Taking? Authorizing Provider  cetirizine (ZYRTEC) 10 MG tablet Take 10 mg by mouth daily as needed for allergies (only in allergy season).   Yes Historical Provider, MD  Cholecalciferol (VITAMIN D3) 50000 units CAPS TAKE ONE CAPSULE BY MOUTH WEEKLY 09/26/15  Yes Veryl Speak, FNP  furosemide (LASIX) 40 MG tablet Take 1 tablet (40 mg total) by mouth daily. 11/30/15  Yes Veryl Speak, FNP  ibuprofen (ADVIL,MOTRIN) 200 MG tablet Take 400 mg by mouth every 6 (six) hours as needed for headache or moderate pain.   Yes Historical Provider, MD  olmesartan (BENICAR) 20 MG tablet Take 1 tablet (20 mg total) by mouth daily. 11/30/15  Yes Veryl Speak, FNP    Review of Systems:   No unusual headaches, no dizziness. No dyspnea or chest pain on exertion. No abdominal pain, change in bowel habits, black or bloody stools.  No urinary  tract symptoms. No new or unusual musculoskeletal symptoms. No edema.   Vitals:   05/21/16 1204  BP: (!) 146/98  Pulse: (!) 114  Temp: (!) 100.4 F (38 C)  TempSrc: Oral  SpO2: 96%  Weight: 212 lb 12.8 oz (96.5 kg)     Body mass index is 37.7 kg/m.   Physical Exam:   General appearance: alert, cooperative and appears stated age HEENT: extra ocular movement intact and OP with PND Lungs: central lung congestion, productive cough, no increased WOB Heart: regular rate and rhythm, S1, S2 normal, no murmur, click, rub or gallop Abdomen: soft, non-tender; bowel sounds normal; no masses,  no organomegaly Extremities: extremities normal, atraumatic, no cyanosis or edema Pulses: 2+ and symmetric Skin: Skin color, texture, turgor normal. No rashes or lesions Neurologic: Grossly normal   Results for orders placed or performed in visit on 05/21/16  POC Influenza A&B (Binax test)  Result Value Ref Range   Influenza A, POC Negative Negative   Influenza B, POC Negative Negative    Assessment and Plan:    [name] was seen today for cough and generalized body aches.  Diagnoses and all orders for this visit:  Cough Comments: With new fever, concerning for bacterial etiology. Orders as below. Work note provided. Precuations discussed. Orders: -     POC Influenza A&B (Binax test) -     azithromycin (ZITHROMAX Z-PAK) 250 MG  tablet; Take 2 tablets ( total of 500 mg) PO on day 1, then 1 tablet ( total of 250 mg) PO q24 x 4 days. -     HYDROcodone-homatropine (HYCODAN) 5-1.5 MG/5ML syrup; Take 5 mLs by mouth at bedtime as needed for cough.   . Reviewed expectations re: course of current medical issues. . Discussed self-management of symptoms. . Outlined signs and symptoms indicating need for more acute intervention. . Patient verbalized understanding and all questions were answered. . See orders for this visit as documented in the electronic medical record. . Patient received an After  Visit Summary.   Helane RimaErica Tobechukwu Emmick, D.O.

## 2016-05-24 DIAGNOSIS — Z1231 Encounter for screening mammogram for malignant neoplasm of breast: Secondary | ICD-10-CM | POA: Diagnosis not present

## 2016-05-24 DIAGNOSIS — Z01419 Encounter for gynecological examination (general) (routine) without abnormal findings: Secondary | ICD-10-CM | POA: Diagnosis not present

## 2016-05-24 DIAGNOSIS — Z124 Encounter for screening for malignant neoplasm of cervix: Secondary | ICD-10-CM | POA: Diagnosis not present

## 2016-05-24 DIAGNOSIS — Z6837 Body mass index (BMI) 37.0-37.9, adult: Secondary | ICD-10-CM | POA: Diagnosis not present

## 2016-06-17 ENCOUNTER — Ambulatory Visit (INDEPENDENT_AMBULATORY_CARE_PROVIDER_SITE_OTHER): Payer: Federal, State, Local not specified - PPO | Admitting: Physician Assistant

## 2016-06-19 ENCOUNTER — Encounter (INDEPENDENT_AMBULATORY_CARE_PROVIDER_SITE_OTHER): Payer: Self-pay | Admitting: Family

## 2016-06-19 ENCOUNTER — Ambulatory Visit (INDEPENDENT_AMBULATORY_CARE_PROVIDER_SITE_OTHER): Payer: Federal, State, Local not specified - PPO | Admitting: Family

## 2016-06-19 ENCOUNTER — Ambulatory Visit (INDEPENDENT_AMBULATORY_CARE_PROVIDER_SITE_OTHER): Payer: Self-pay

## 2016-06-19 VITALS — Ht 63.0 in | Wt 212.0 lb

## 2016-06-19 DIAGNOSIS — M25562 Pain in left knee: Secondary | ICD-10-CM

## 2016-06-19 DIAGNOSIS — M1712 Unilateral primary osteoarthritis, left knee: Secondary | ICD-10-CM | POA: Diagnosis not present

## 2016-06-19 DIAGNOSIS — G8929 Other chronic pain: Secondary | ICD-10-CM

## 2016-06-19 MED ORDER — TROLAMINE SALICYLATE 10 % EX CREA: 1.0000 "application " | TOPICAL_CREAM | CUTANEOUS | 0 refills | Status: DC | PRN

## 2016-06-19 NOTE — Progress Notes (Signed)
Office Visit Note   Patient: Bailey Ward           Date of Birth: 02-21-1966           MRN: [id number] Visit Date: 06/19/2016              Requested by: Veryl Speak, FNP [address] [place], [place] [address] PCP: Jeanine Luz, FNP  Chief Complaint  Patient presents with  . Left Knee - Pain    HPI: Patient is here today for left knee pain. She states that it has been hurting for a "few years" but that it has increased in pain about one month ago. She states that she fell on Monday and the pain has gotten even worse. Pt states that She has pain on the medial side and some swelling at times. She states that it has given out on her before. She is taking Ibuprofen prn pain.   The patient is a 51 year old woman who presents today for evaluation of left knee pain. States has been ongoing for several years. Did have worsening of pain over the last month. Did fall this past Monday pain has been worse since. Complaining of medial pain with swelling that is intermittent. Denies routine mechanical symptoms. Did have a give way once. Aching ibuprofen with moderate relief of pain.    Assessment & Plan: Visit Diagnoses:  1. Chronic pain of left knee   2. Primary osteoarthritis of left knee     Plan: Injection today. May use ice. May continue ibuprofen prn. Will follow up in office in 4 weeks for reevaluation.   Follow-Up Instructions: Return in about 4 weeks (around 07/17/2016), or if symptoms worsen or fail to improve.   Physical Exam  Constitutional: Appears well-developed.  Head: Normocephalic.  Eyes: EOM are normal.  Neck: Normal range of motion.  Cardiovascular: Normal rate.   Pulmonary/Chest: Effort normal.  Neurological: Is alert.  Skin: Skin is warm.  Psychiatric: Has a normal mood and affect.  Left Knee Exam   Tenderness  The patient is experiencing tenderness in the medial joint line and medial retinaculum.  Range of Motion  Extension: normal  Flexion:  normal   Muscle Strength   The patient has normal left knee strength.  Tests  Varus: negative Valgus: negative  Other  Swelling: none Effusion: no effusion present  Comments:  Crepitation with rom      Imaging: No results found.  Labs: No results found for: HGBA1C, ESRSEDRATE, CRP, LABURIC, REPTSTATUS, GRAMSTAIN, CULT, LABORGA  Orders:  Orders Placed This Encounter  Procedures  . Large Joint Injection/Arthrocentesis  . XR Knee 1-2 Views Left   Meds ordered this encounter  Medications  . trolamine salicylate (ASPERCREME/ALOE) 10 % cream    Sig: Apply 1 application topically as needed for muscle pain.    Dispense:  85 g    Refill:  0     Procedures: Large Joint Inj Date/Time: 06/21/2016 10:37 AM Performed by: Barnie Del R Authorized by: Barnie Del R   Consent Given by:  Patient Site marked: the procedure site was marked   Timeout: prior to procedure the correct patient, procedure, and site was verified   Indications:  Pain and diagnostic evaluation Location:  Knee Site:  L knee Needle Size:  22 G Needle Length:  1.5 inches Ultrasound Guidance: No   Fluoroscopic Guidance: No   Arthrogram: No   Medications:  5 mL lidocaine 1 %; 40 mg methylPREDNISolone acetate 40 MG/ML Aspiration  Attempted: No   Patient tolerance:  Patient tolerated the procedure well with no immediate complications    Clinical Data: No additional findings.  Subjective: Review of Systems  Constitutional: Negative for chills and fever.    Objective: Vital Signs: Ht 5\' 3"  (1.6 m)   Wt 212 lb (96.2 kg)   BMI 37.55 kg/m   Specialty Comments:  No specialty comments available.  PMFS History: Patient Active Problem List   Diagnosis Date Noted  . Encounter for general adult medical examination with abnormal findings 08/01/2015  . Obesity 08/01/2015  . Hair loss 09/08/2014  . Essential hypertension 06/24/2014   Past Medical History:  Diagnosis Date  . Allergy   .  Hypertension     Family History  Problem Relation Age of Onset  . Hypertension Mother   . Diabetes Father   . Hypertension Father     Past Surgical History:  Procedure Laterality Date  . ABDOMINAL HYSTERECTOMY     Social History   Occupational History  . Annuity Specialist    Social History Main Topics  . Smoking status: Never Smoker  . Smokeless tobacco: Never Used  . Alcohol use No  . Drug use: No  . Sexual activity: Not on file

## 2016-06-21 DIAGNOSIS — M1712 Unilateral primary osteoarthritis, left knee: Secondary | ICD-10-CM

## 2016-06-21 MED ORDER — METHYLPREDNISOLONE ACETATE 40 MG/ML IJ SUSP
40.0000 mg | INTRAMUSCULAR | Status: AC | PRN
Start: 2016-06-21 — End: 2016-06-21
  Administered 2016-06-21: 40 mg via INTRA_ARTICULAR

## 2016-06-21 MED ORDER — LIDOCAINE HCL 1 % IJ SOLN
5.0000 mL | INTRAMUSCULAR | Status: AC | PRN
  Administered 2016-06-21: 5 mL

## 2016-06-24 ENCOUNTER — Encounter: Payer: Self-pay | Admitting: Obstetrics

## 2016-07-17 ENCOUNTER — Ambulatory Visit (INDEPENDENT_AMBULATORY_CARE_PROVIDER_SITE_OTHER): Payer: Federal, State, Local not specified - PPO | Admitting: Family

## 2016-07-17 ENCOUNTER — Encounter (INDEPENDENT_AMBULATORY_CARE_PROVIDER_SITE_OTHER): Payer: Self-pay | Admitting: Family

## 2016-07-17 VITALS — Ht 63.0 in | Wt 212.0 lb

## 2016-07-17 DIAGNOSIS — M25562 Pain in left knee: Secondary | ICD-10-CM | POA: Diagnosis not present

## 2016-07-17 DIAGNOSIS — G8929 Other chronic pain: Secondary | ICD-10-CM | POA: Diagnosis not present

## 2016-07-17 NOTE — Progress Notes (Signed)
   Office Visit Note   Patient: Bailey Ward           Date of Birth: [birth year]           MRN: [id number] Visit Date: 07/17/2016              Requested by: Veryl Speak, FNP [address] [place], [place] [address] PCP: Jeanine Luz, FNP  Chief Complaint  Patient presents with  . Left Knee - Follow-up      HPI: The patient is a 51 year old woman who presents today for evaluation of left knee pain. She has been having ongoing knee pain for about 9 months. Did have acute twisting injury a little over a month ago. This worsened her pain. Complains of pain with start up stiffness difficulty getting from a seated to standing position. Does complain of locking and catching in her knee. Has been using Aspercreme with. States she is using ibuprofen as well. Also wearing a knee neoprene sleeve, states the compression feels good. He'll she is not doing much better than at last visit 4 weeks ago. Did decline cortisone injection at last visit.   Assessment & Plan: Visit Diagnoses:  1. Chronic pain of left knee     Plan: We will proceed with MRI left knee. Continue with ibuprofen. Again declined injection today. Have advised she may return for Depo-Medrol injection when her husband can a company for support.  Follow-Up Instructions: Return for p mri.   Left Knee Exam   Tenderness  The patient is experiencing tenderness in the lateral joint line and medial joint line.  Range of Motion  The patient has normal left knee ROM.  Tests  Varus: negative Valgus: negative  Other  Effusion: no effusion present      Patient is alert, oriented, no adenopathy, well-dressed, normal affect, normal respiratory effort.  Imaging: No results found.  Labs: No results found for: HGBA1C, ESRSEDRATE, CRP, LABURIC, REPTSTATUS, GRAMSTAIN, CULT, LABORGA  Orders:  Orders Placed This Encounter  Procedures  . MR Knee Left w/o contrast   No orders of the defined types were placed in  this encounter.    Procedures: No procedures performed  Clinical Data: No additional findings.  ROS: Review of Systems  Constitutional: Negative for chills and fever.  Musculoskeletal: Positive for arthralgias and gait problem.    Objective: Vital Signs: Ht  (1.6 m)   Wt 212 lb (96.2 kg)   BMI 37.55 kg/m   Specialty Comments:  No specialty comments available.  PMFS History: Patient Active Problem List   Diagnosis Date Noted  . Encounter for general adult medical examination with abnormal findings 08/01/2015  . Obesity 08/01/2015  . Hair loss 09/08/2014  . Essential hypertension 06/24/2014   Past Medical History:  Diagnosis Date  . Allergy   . Hypertension     Family History  Problem Relation Age of Onset  . Hypertension Mother   . Diabetes Father   . Hypertension Father     Past Surgical History:  Procedure Laterality Date  . ABDOMINAL HYSTERECTOMY     Social History   Occupational History  . Annuity Specialist    Social History Main Topics  . Smoking status: Never Smoker  . Smokeless tobacco: Never Used  . Alcohol use No  . Drug use: No  . Sexual activity: Not on file

## 2016-07-23 ENCOUNTER — Ambulatory Visit (HOSPITAL_COMMUNITY): Admission: RE | Admit: 2016-07-23 | Payer: Federal, State, Local not specified - PPO | Source: Ambulatory Visit

## 2016-08-02 ENCOUNTER — Ambulatory Visit (HOSPITAL_COMMUNITY)
Admission: RE | Admit: 2016-08-02 | Discharge: 2016-08-02 | Disposition: A | Discharge status: Home / Self Care | Payer: Federal, State, Local not specified - PPO | Source: Ambulatory Visit | Attending: Family | Admitting: Family

## 2016-08-02 ENCOUNTER — Ambulatory Visit (INDEPENDENT_AMBULATORY_CARE_PROVIDER_SITE_OTHER): Payer: Federal, State, Local not specified - PPO | Admitting: Family

## 2016-08-02 ENCOUNTER — Encounter: Payer: Self-pay | Admitting: Family

## 2016-08-02 VITALS — BP 128/88 | HR 83 | Temp 98.6°F | Resp 16 | Ht 63.0 in | Wt 218.0 lb

## 2016-08-02 DIAGNOSIS — M7122 Synovial cyst of popliteal space [Baker], left knee: Secondary | ICD-10-CM | POA: Diagnosis not present

## 2016-08-02 DIAGNOSIS — E559 Vitamin D deficiency, unspecified: Secondary | ICD-10-CM

## 2016-08-02 DIAGNOSIS — M25562 Pain in left knee: Secondary | ICD-10-CM | POA: Diagnosis not present

## 2016-08-02 DIAGNOSIS — Z0001 Encounter for general adult medical examination with abnormal findings: Secondary | ICD-10-CM

## 2016-08-02 DIAGNOSIS — I10 Essential (primary) hypertension: Secondary | ICD-10-CM | POA: Diagnosis not present

## 2016-08-02 DIAGNOSIS — Z0000 Encounter for general adult medical examination without abnormal findings: Secondary | ICD-10-CM

## 2016-08-02 DIAGNOSIS — Z6838 Body mass index (BMI) 38.0-38.9, adult: Secondary | ICD-10-CM

## 2016-08-02 DIAGNOSIS — Z1211 Encounter for screening for malignant neoplasm of colon: Secondary | ICD-10-CM | POA: Diagnosis not present

## 2016-08-02 DIAGNOSIS — G8929 Other chronic pain: Secondary | ICD-10-CM | POA: Diagnosis not present

## 2016-08-02 MED ORDER — FUROSEMIDE 40 MG PO TABS: 40.0000 mg | ORAL_TABLET | Freq: Every day | ORAL | 1 refills | Status: DC

## 2016-08-02 NOTE — Assessment & Plan Note (Signed)
BMI of 38.62. Recommend weight loss of 5-10% of current body weight. Recommend increasing physical activity to 30 minutes of moderate level activity daily. Encourage nutritional intake that focuses on nutrient dense foods and is moderate, varied, and balanced and is low in saturated fats and processed/sugary foods. Continue to monitor.

## 2016-08-02 NOTE — Assessment & Plan Note (Signed)
Previously noted to have vitamin D deficiency. Obtain vitamin D levels. Additional treatment pending blood work results.

## 2016-08-02 NOTE — Progress Notes (Signed)
Subjective:    Patient ID: Bailey Ward, female    DOB: 1966/01/17, 51 y.o.   MRN: [id number]  Chief Complaint  Patient presents with  . CPE    not fasting    HPI:  Bailey Ward is a 51 y.o. female who presents today for an annual wellness visit.   1) Health Maintenance -   Diet - Averages about 3 meals per day consisting of a regular diet; Caffeine intake 2-3 cups every not and then  Exercise - Here and there; walking from the parking deck to work and 3 flights of stairs.   2) Preventative Exams / Immunizations:  Dental -- Up to date   Vision -- Up to date   Health Maintenance  Topic Date Due  . HIV Screening  01/10/1981  . COLONOSCOPY  01/11/2016  . INFLUENZA VACCINE  11/06/2016  . PAP SMEAR  03/22/2018  . MAMMOGRAM  05/24/2018  . TETANUS/TDAP  08/10/2020     There is no immunization history on file for this patient.   No Known Allergies   Outpatient Medications Prior to Visit  Medication Sig Dispense Refill  . cetirizine (ZYRTEC) 10 MG tablet Take 10 mg by mouth daily as needed for allergies (only in allergy season).    . Cholecalciferol (VITAMIN D3) 50000 units CAPS TAKE ONE CAPSULE BY MOUTH WEEKLY 6 capsule 0  . ibuprofen (ADVIL,MOTRIN) 200 MG tablet Take 400 mg by mouth every 6 (six) hours as needed for headache or moderate pain.    Marland Kitchen trolamine salicylate (ASPERCREME/ALOE) 10 % cream Apply 1 application topically as needed for muscle pain. 85 g 0  . azithromycin (ZITHROMAX Z-PAK) 250 MG tablet Take 2 tablets ( total of 500 mg) PO on day 1, then 1 tablet ( total of 250 mg) PO q24 x 4 days. 6 each 0  . furosemide (LASIX) 40 MG tablet Take 1 tablet (40 mg total) by mouth daily. 30 tablet 3  . HYDROcodone-homatropine (HYCODAN) 5-1.5 MG/5ML syrup Take 5 mLs by mouth at bedtime as needed for cough. 120 mL 0  . olmesartan (BENICAR) 20 MG tablet Take 1 tablet (20 mg total) by mouth daily. 30 tablet 1   No facility-administered medications  prior to visit.      Past Medical History:  Diagnosis Date  . Allergy   . Hypertension      Past Surgical History:  Procedure Laterality Date  . ABDOMINAL HYSTERECTOMY       Family History  Problem Relation Age of Onset  . Hypertension Mother   . Diabetes Father   . Hypertension Father      Social History   Social History  . Marital status: Married    Spouse name: N/A  . Number of children: 2  . Years of education: 62   Occupational History  . Annuity Specialist    Social History Main Topics  . Smoking status: Never Smoker  . Smokeless tobacco: Never Used  . Alcohol use No  . Drug use: No  . Sexual activity: Not on file   Other Topics Concern  . Not on file   Social History Narrative   Born in [place], [place] and raised in [place]. Fun: Travel, go to [place], watch her daughter play soccer, watch movies   Denies religious beliefs that would effect health care.    Denies abuse and feels safe at home.       Review of Systems  Constitutional: Denies fever, chills, fatigue, or  significant weight gain/loss. HENT: Head: Denies headache or neck pain Ears: Denies changes in hearing, ringing in ears, earache, drainage Nose: Denies discharge, stuffiness, itching, nosebleed, sinus pain Throat: Denies sore throat, hoarseness, dry mouth, sores, thrush Eyes: Denies loss/changes in vision, pain, redness, blurry/double vision, flashing lights Cardiovascular: Denies chest pain/discomfort, tightness, palpitations, shortness of breath with activity, difficulty lying down, swelling, sudden awakening with shortness of breath Respiratory: Denies shortness of breath, cough, sputum production, wheezing Gastrointestinal: Denies dysphasia, heartburn, change in appetite, nausea, change in bowel habits, rectal bleeding, constipation, diarrhea, yellow skin or eyes Genitourinary: Denies frequency, urgency, burning/pain, blood in urine, incontinence, change in urinary  strength. Musculoskeletal: Denies muscle/joint pain, stiffness, back pain, redness or swelling of joints, trauma Skin: Denies rashes, lumps, itching, dryness, color changes, or hair/nail changes Neurological: Denies dizziness, fainting, seizures, weakness, numbness, tingling, tremor Psychiatric - Denies nervousness, stress, depression or memory loss Endocrine: Denies heat or cold intolerance, sweating, frequent urination, excessive thirst, changes in appetite Hematologic: Denies ease of bruising or bleeding     Objective:     BP 128/88 (BP Location: Left Arm, Patient Position: Sitting, Cuff Size: Large)   Pulse 83   Temp 98.6 F (37 C) (Oral)   Resp 16   Ht  (1.6 m)   Wt 218 lb (98.9 kg)   SpO2 98%   BMI 38.62 kg/m  Nursing note and vital signs reviewed.  Wt Readings from Last 3 Encounters:  08/02/16 218 lb (98.9 kg)  07/17/16 212 lb (96.2 kg)  06/19/16 212 lb (96.2 kg)    Physical Exam  Constitutional: She is oriented to person, place, and time. She appears well-developed and well-nourished.  HENT:  Head: Normocephalic.  Right Ear: Hearing, tympanic membrane, external ear and ear canal normal.  Left Ear: Hearing, tympanic membrane, external ear and ear canal normal.  Nose: Nose normal.  Mouth/Throat: Uvula is midline, oropharynx is clear and moist and mucous membranes are normal.  Eyes: Conjunctivae and EOM are normal. Pupils are equal, round, and reactive to light.  Neck: Neck supple. No JVD present. No tracheal deviation present. No thyromegaly present.  Cardiovascular: Normal rate, regular rhythm, normal heart sounds and intact distal pulses.   Pulmonary/Chest: Effort normal and breath sounds normal.  Abdominal: Soft. Bowel sounds are normal. She exhibits no distension and no mass. There is no tenderness. There is no rebound and no guarding.  Musculoskeletal: Normal range of motion. She exhibits no edema or tenderness.  Left knee with mild/moderate edema.    Lymphadenopathy:    She has no cervical adenopathy.  Neurological: She is alert and oriented to person, place, and time. She has normal reflexes. No cranial nerve deficit. She exhibits normal muscle tone. Coordination normal.  Skin: Skin is warm and dry.  Psychiatric: She has a normal mood and affect. Her behavior is normal. Judgment and thought content normal.       Assessment & Plan:   Problem List Items Addressed This Visit      Cardiovascular and Mediastinum   Essential hypertension    Blood pressure appears adequate control and below goal 140/90 with current medication regimen and no adverse side effects. Continue to monitor blood pressure at home and follow low-sodium diet.      Relevant Medications   furosemide (LASIX) 40 MG tablet     Other   Encounter for general adult medical examination with abnormal findings - Primary    1) Anticipatory Guidance: Discussed importance of wearing a seatbelt while driving  and not texting while driving; changing batteries in smoke detector at least once annually; wearing suntan lotion when outside; eating a balanced and moderate diet; getting physical activity at least 30 minutes per day.  2) Immunizations / Screenings / Labs:  All immunizations are up-to-date per recommendations. Due for a colon cancer screening with referral to gastroenterology placed. Breast cancer and cervical cancer screenings are up-to-date per recommendations. Obtain CBC, CMET, and lipid profile.    Overall well exam with risk factors for cardiovascular disease including obesity and hypertension. Recommend weight loss of 5-10% of current body weight through nutrition and physical activity changes. Encouraged to consume a nutritional intake that is moderate, balance, and varied. Increase physical activity as tolerated which is currently complicated by left knee pain which she is working with orthopedics for. Continue other healthy lifestyle behaviors and choices. Follow-up  prevention exam in 1 year. Follow-up office visit for chronic conditions.      Relevant Orders   CBC   Comprehensive metabolic panel   Lipid panel   Obesity    BMI of 38.62. Recommend weight loss of 5-10% of current body weight. Recommend increasing physical activity to 30 minutes of moderate level activity daily. Encourage nutritional intake that focuses on nutrient dense foods and is moderate, varied, and balanced and is low in saturated fats and processed/sugary foods. Continue to monitor.        Vitamin D deficiency    Previously noted to have vitamin D deficiency. Obtain vitamin D levels. Additional treatment pending blood work results.      Relevant Orders   VITAMIN D 25 Hydroxy (Vit-D Deficiency, Fractures)    Other Visit Diagnoses    Colon cancer screening       Relevant Orders   Ambulatory referral to Gastroenterology       I have discontinued Bailey Ward's olmesartan, azithromycin, and HYDROcodone-homatropine. I am also having her maintain her ibuprofen, cetirizine, Vitamin D3, trolamine salicylate, and furosemide.   Meds ordered this encounter  Medications  . furosemide (LASIX) 40 MG tablet    Sig: Take 1 tablet (40 mg total) by mouth daily.    Dispense:  90 tablet    Refill:  1    Order Specific Question:   Supervising Provider    Answer:   Hillard Danker A [[id number]]     Follow-up: Return in about 6 months (around 02/01/2017), or if symptoms worsen or fail to improve.   Jeanine Luz, FNP

## 2016-08-02 NOTE — Patient Instructions (Signed)
Thank you for choosing [facility].  SUMMARY AND INSTRUCTIONS:  Please continue to take the furosemide as prescribed.  Recommend weight loss of 5-10% of current body weight or about 10-15 pounds.  Goal is to increase physical activity to 30 minutes of moderate level activity daily or approximately 10,000 steps per day.  They will call to schedule your appointment with gastroenterology for your colon cancer screening.  Medication:  Your prescription(s) have been submitted to your pharmacy or been printed and provided for you. Please take as directed and contact our office if you believe you are having problem(s) with the medication(s) or have any questions.  Labs:  Please stop by the lab on the lower level of the building for your blood work. Your results will be released to [facility] (or called to you) after review, usually within 72 hours after test completion. If any changes need to be made, you will be notified at that same time.  1.) The lab is open from 7:30am to 5:30 pm Monday-Friday 2.) No appointment is necessary 3.) Fasting (if needed) is 6-8 hours after food and drink; black coffee and water are okay    Follow up:  If your symptoms worsen or fail to improve, please contact our office for further instruction, or in case of emergency go directly to the emergency room at the closest medical facility.   Health Maintenance, Female Adopting a healthy lifestyle and getting preventive care can go a long way to promote health and wellness. Talk with your health care provider about what schedule of regular examinations is right for you. This is a good chance for you to check in with your provider about disease prevention and staying healthy. In between checkups, there are plenty of things you can do on your own. Experts have done a lot of research about which lifestyle changes and preventive measures are most likely to keep you healthy. Ask your health care provider for more  information. Weight and diet Eat a healthy diet  Be sure to include plenty of vegetables, fruits, low-fat dairy products, and lean protein.  Do not eat a lot of foods high in solid fats, added sugars, or salt.  Get regular exercise. This is one of the most important things you can do for your health.  Most adults should exercise for at least 150 minutes each week. The exercise should increase your heart rate and make you sweat (moderate-intensity exercise).  Most adults should also do strengthening exercises at least twice a week. This is in addition to the moderate-intensity exercise. Maintain a healthy weight  Body mass index (BMI) is a measurement that can be used to identify possible weight problems. It estimates body fat based on height and weight. Your health care provider can help determine your BMI and help you achieve or maintain a healthy weight.  For females 73 years of age and older:  A BMI below 18.5 is considered underweight.  A BMI of 18.5 to 24.9 is normal.  A BMI of 25 to 29.9 is considered overweight.  A BMI of 30 and above is considered obese. Watch levels of cholesterol and blood lipids  You should start having your blood tested for lipids and cholesterol at 51 years of age, then have this test every 5 years.  You may need to have your cholesterol levels checked more often if:  Your lipid or cholesterol levels are high.  You are older than 51 years of age.  You are at high risk for  heart disease. Cancer screening Lung Cancer  Lung cancer screening is recommended for adults 102-31 years old who are at high risk for lung cancer because of a history of smoking.  A yearly low-dose CT scan of the lungs is recommended for people who:  Currently smoke.  Have quit within the past 15 years.  Have at least a 30-pack-year history of smoking. A pack year is smoking an average of one pack of cigarettes a day for 1 year.  Yearly screening should continue until  it has been 15 years since you quit.  Yearly screening should stop if you develop a health problem that would prevent you from having lung cancer treatment. Breast Cancer  Practice breast self-awareness. This means understanding how your breasts normally appear and feel.  It also means doing regular breast self-exams. Let your health care provider know about any changes, no matter how small.  If you are in your 20s or 30s, you should have a clinical breast exam (CBE) by a health care provider every 1-3 years as part of a regular health exam.  If you are 21 or older, have a CBE every year. Also consider having a breast X-ray (mammogram) every year.  If you have a family history of breast cancer, talk to your health care provider about genetic screening.  If you are at high risk for breast cancer, talk to your health care provider about having an MRI and a mammogram every year.  Breast cancer gene (BRCA) assessment is recommended for women who have family members with BRCA-related cancers. BRCA-related cancers include:  Breast.  Ovarian.  Tubal.  Peritoneal cancers.  Results of the assessment will determine the need for genetic counseling and BRCA1 and BRCA2 testing. Cervical Cancer  Your health care provider may recommend that you be screened regularly for cancer of the pelvic organs (ovaries, uterus, and vagina). This screening involves a pelvic examination, including checking for microscopic changes to the surface of your cervix (Pap test). You may be encouraged to have this screening done every 3 years, beginning at age 10.  For women ages 80-65, health care providers may recommend pelvic exams and Pap testing every 3 years, or they may recommend the Pap and pelvic exam, combined with testing for human papilloma virus (HPV), every 5 years. Some types of HPV increase your risk of cervical cancer. Testing for HPV may also be done on women of any age with unclear Pap test  results.  Other health care providers may not recommend any screening for nonpregnant women who are considered low risk for pelvic cancer and who do not have symptoms. Ask your health care provider if a screening pelvic exam is right for you.  If you have had past treatment for cervical cancer or a condition that could lead to cancer, you need Pap tests and screening for cancer for at least 20 years after your treatment. If Pap tests have been discontinued, your risk factors (such as having a new sexual partner) need to be reassessed to determine if screening should resume. Some women have medical problems that increase the chance of getting cervical cancer. In these cases, your health care provider may recommend more frequent screening and Pap tests. Colorectal Cancer  This type of cancer can be detected and often prevented.  Routine colorectal cancer screening usually begins at 51 years of age and continues through 51 years of age.  Your health care provider may recommend screening at an earlier age if you have risk factors  for colon cancer.  Your health care provider may also recommend using home test kits to check for hidden blood in the stool.  A small camera at the end of a tube can be used to examine your colon directly (sigmoidoscopy or colonoscopy). This is done to check for the earliest forms of colorectal cancer.  Routine screening usually begins at age 49.  Direct examination of the colon should be repeated every 5-10 years through 51 years of age. However, you may need to be screened more often if early forms of precancerous polyps or small growths are found. Skin Cancer  Check your skin from head to toe regularly.  Tell your health care provider about any new moles or changes in moles, especially if there is a change in a mole's shape or color.  Also tell your health care provider if you have a mole that is larger than the size of a pencil eraser.  Always use sunscreen.  Apply sunscreen liberally and repeatedly throughout the day.  Protect yourself by wearing long sleeves, pants, a wide-brimmed hat, and sunglasses whenever you are outside. Heart disease, diabetes, and high blood pressure  High blood pressure causes heart disease and increases the risk of stroke. High blood pressure is more likely to develop in:  People who have blood pressure in the high end of the normal range (130-139/85-89 mm Hg).  People who are overweight or obese.  People who are African American.  If you are 22-9 years of age, have your blood pressure checked every 3-5 years. If you are 2 years of age or older, have your blood pressure checked every year. You should have your blood pressure measured twice-once when you are at a hospital or clinic, and once when you are not at a hospital or clinic. Record the average of the two measurements. To check your blood pressure when you are not at a hospital or clinic, you can use:  An automated blood pressure machine at a pharmacy.  A home blood pressure monitor.  If you are between 74 years and 41 years old, ask your health care provider if you should take aspirin to prevent strokes.  Have regular diabetes screenings. This involves taking a blood sample to check your fasting blood sugar level.  If you are at a normal weight and have a low risk for diabetes, have this test once every three years after 51 years of age.  If you are overweight and have a high risk for diabetes, consider being tested at a younger age or more often. Preventing infection Hepatitis B  If you have a higher risk for hepatitis B, you should be screened for this virus. You are considered at high risk for hepatitis B if:  You were born in a country where hepatitis B is common. Ask your health care provider which countries are considered high risk.  Your parents were born in a high-risk country, and you have not been immunized against hepatitis B (hepatitis B  vaccine).  You have HIV or AIDS.  You use needles to inject street drugs.  You live with someone who has hepatitis B.  You have had sex with someone who has hepatitis B.  You get hemodialysis treatment.  You take certain medicines for conditions, including cancer, organ transplantation, and autoimmune conditions. Hepatitis C  Blood testing is recommended for:  Everyone born from [birth year] through 1965.  Anyone with known risk factors for hepatitis C. Sexually transmitted infections (STIs)  You should be screened  for sexually transmitted infections (STIs) including gonorrhea and chlamydia if:  You are sexually active and are younger than 51 years of age.  You are older than 51 years of age and your health care provider tells you that you are at risk for this type of infection.  Your sexual activity has changed since you were last screened and you are at an increased risk for chlamydia or gonorrhea. Ask your health care provider if you are at risk.  If you do not have HIV, but are at risk, it may be recommended that you take a prescription medicine daily to prevent HIV infection. This is called pre-exposure prophylaxis (PrEP). You are considered at risk if:  You are sexually active and do not regularly use condoms or know the HIV status of your partner(s).  You take drugs by injection.  You are sexually active with a partner who has HIV. Talk with your health care provider about whether you are at high risk of being infected with HIV. If you choose to begin PrEP, you should first be tested for HIV. You should then be tested every 3 months for as long as you are taking PrEP. Pregnancy  If you are premenopausal and you may become pregnant, ask your health care provider about preconception counseling.  If you may become pregnant, take 400 to 800 micrograms (mcg) of folic acid every day.  If you want to prevent pregnancy, talk to your health care provider about birth control  (contraception). Osteoporosis and menopause  Osteoporosis is a disease in which the bones lose minerals and strength with aging. This can result in serious bone fractures. Your risk for osteoporosis can be identified using a bone density scan.  If you are 53 years of age or older, or if you are at risk for osteoporosis and fractures, ask your health care provider if you should be screened.  Ask your health care provider whether you should take a calcium or vitamin D supplement to lower your risk for osteoporosis.  Menopause may have certain physical symptoms and risks.  Hormone replacement therapy may reduce some of these symptoms and risks. Talk to your health care provider about whether hormone replacement therapy is right for you. Follow these instructions at home:  Schedule regular health, dental, and eye exams.  Stay current with your immunizations.  Do not use any tobacco products including cigarettes, chewing tobacco, or electronic cigarettes.  If you are pregnant, do not drink alcohol.  If you are breastfeeding, limit how much and how often you drink alcohol.  Limit alcohol intake to no more than 1 drink per day for nonpregnant women. One drink equals 12 ounces of beer, 5 ounces of wine, or 1 ounces of hard liquor.  Do not use street drugs.  Do not share needles.  Ask your health care provider for help if you need support or information about quitting drugs.  Tell your health care provider if you often feel depressed.  Tell your health care provider if you have ever been abused or do not feel safe at home. This information is not intended to replace advice given to you by your health care provider. Make sure you discuss any questions you have with your health care provider. Document Released: 10/08/2010 Document Revised: 08/31/2015 Document Reviewed: 12/27/2014 Elsevier Interactive Patient Education  2017 [employer].

## 2016-08-02 NOTE — Assessment & Plan Note (Signed)
Blood pressure appears adequate control and below goal 140/90 with current medication regimen and no adverse side effects. Continue to monitor blood pressure at home and follow low-sodium diet.

## 2016-08-02 NOTE — Assessment & Plan Note (Signed)
1) Anticipatory Guidance: Discussed importance of wearing a seatbelt while driving and not texting while driving; changing batteries in smoke detector at least once annually; wearing suntan lotion when outside; eating a balanced and moderate diet; getting physical activity at least 30 minutes per day.  2) Immunizations / Screenings / Labs:  All immunizations are up-to-date per recommendations. Due for a colon cancer screening with referral to gastroenterology placed. Breast cancer and cervical cancer screenings are up-to-date per recommendations. Obtain CBC, CMET, and lipid profile.    Overall well exam with risk factors for cardiovascular disease including obesity and hypertension. Recommend weight loss of 5-10% of current body weight through nutrition and physical activity changes. Encouraged to consume a nutritional intake that is moderate, balance, and varied. Increase physical activity as tolerated which is currently complicated by left knee pain which she is working with orthopedics for. Continue other healthy lifestyle behaviors and choices. Follow-up prevention exam in 1 year. Follow-up office visit for chronic conditions.

## 2016-08-05 ENCOUNTER — Telehealth (INDEPENDENT_AMBULATORY_CARE_PROVIDER_SITE_OTHER): Payer: Self-pay

## 2016-08-05 NOTE — Telephone Encounter (Signed)
Left voicemail advising patient to return call for an appointment with Dr. Lajoyce Corners.

## 2016-08-05 NOTE — Telephone Encounter (Signed)
-----   Message from Erin R Zamora, NP sent at 08/05/2016  8:44 AM EDT ----- Regarding: mri review Please schedule mri review iwht duda ----- Message ----- From: Interface, Rad Results In Sent: 08/02/2016   2:49 PM To: Erin R Zamora, NP    

## 2016-08-07 ENCOUNTER — Telehealth (INDEPENDENT_AMBULATORY_CARE_PROVIDER_SITE_OTHER): Payer: Self-pay

## 2016-08-07 NOTE — Telephone Encounter (Signed)
-----   Message from Adonis Huguenin, NP sent at 08/05/2016  8:44 AM EDT ----- Regarding: mri review Please schedule mri review iwht duda ----- Message ----- From: Interface, Rad Results In Sent: 08/02/2016   2:49 PM To: Adonis Huguenin, NP

## 2016-08-07 NOTE — Telephone Encounter (Signed)
Called and left voicemail advising patient to return call to schdeule an appointment with Dr. Lajoyce Corners for MRI results.

## 2016-08-08 ENCOUNTER — Encounter (INDEPENDENT_AMBULATORY_CARE_PROVIDER_SITE_OTHER): Payer: Federal, State, Local not specified - PPO | Admitting: Orthopedic Surgery

## 2016-08-08 ENCOUNTER — Encounter (INDEPENDENT_AMBULATORY_CARE_PROVIDER_SITE_OTHER): Payer: Self-pay

## 2016-08-08 ENCOUNTER — Encounter (INDEPENDENT_AMBULATORY_CARE_PROVIDER_SITE_OTHER): Payer: Self-pay | Admitting: Orthopedic Surgery

## 2016-08-12 ENCOUNTER — Telehealth (INDEPENDENT_AMBULATORY_CARE_PROVIDER_SITE_OTHER): Payer: Self-pay

## 2016-08-12 NOTE — Telephone Encounter (Signed)
Have called several times to schedule an appoinment , left messagaes for patient to return call concerning MRI results

## 2016-08-13 ENCOUNTER — Ambulatory Visit (INDEPENDENT_AMBULATORY_CARE_PROVIDER_SITE_OTHER): Payer: Federal, State, Local not specified - PPO | Admitting: Orthopedic Surgery

## 2016-08-13 ENCOUNTER — Encounter (INDEPENDENT_AMBULATORY_CARE_PROVIDER_SITE_OTHER): Payer: Self-pay | Admitting: Orthopedic Surgery

## 2016-08-13 DIAGNOSIS — M25562 Pain in left knee: Secondary | ICD-10-CM

## 2016-08-13 NOTE — Progress Notes (Signed)
   Office Visit Note   Patient: Bailey DecampKim Y Troy-Richardson           Date of Birth: 06/03/1965           MRN: [id number] Visit Date: 08/13/2016              Requested by: Veryl Speakalone, Gregory D, FNP [address] [place], [place] [address] PCP: Veryl Speakalone, Gregory D, FNP  Chief Complaint  Patient presents with  . Left Knee - Pain, Follow-up      HPI: Patient presents for follow-up for left knee pain off and on for the past 2 years with swelling. Patient complains of pain posteriorly medially and in the patellofemoral joint.  Assessment & Plan: Visit Diagnoses:  1. Left knee pain, unspecified chronicity     Plan: Recommended strengthening VMO strengthening and conditioning. She'll continue the anti-inflammatories the proper use of anti-inflammatories were discussed as well as limiting their use to minimize risk of kidney or GI upset. Follow-up as needed discussed that we could consider a steroid injection as well as a hyaluronic acid injection.  Follow-Up Instructions: Return if symptoms worsen or fail to improve.   Ortho Exam  Patient is alert, oriented, no adenopathy, well-dressed, normal affect, normal respiratory effort. Patient has an antalgic gait examination she has full range of motion of both knees she has no effusion she is tender to palpation medial joint line as well as patellofemoral joint line" stable. Review of the MRI scan shows thinning of the cartilage in the patellofemoral joint as well as medial joint line there is a small Baker's cyst otherwise no significant internal derangement.  Imaging: No results found.  Labs: No results found for: HGBA1C, ESRSEDRATE, CRP, LABURIC, REPTSTATUS, GRAMSTAIN, CULT, LABORGA  Orders:  No orders of the defined types were placed in this encounter.  No orders of the defined types were placed in this encounter.    Procedures: No procedures performed  Clinical Data: No additional findings.  ROS:  All other systems negative, except as  noted in the HPI. Review of Systems  Objective: Vital Signs: There were no vitals taken for this visit.  Specialty Comments:  No specialty comments available.  PMFS History: Patient Active Problem List   Diagnosis Date Noted  . Vitamin D deficiency 08/02/2016  . Encounter for general adult medical examination with abnormal findings 08/01/2015  . Obesity 08/01/2015  . Hair loss 09/08/2014  . Essential hypertension 06/24/2014   Past Medical History:  Diagnosis Date  . Allergy   . Hypertension     Family History  Problem Relation Age of Onset  . Hypertension Mother   . Diabetes Father   . Hypertension Father     Past Surgical History:  Procedure Laterality Date  . ABDOMINAL HYSTERECTOMY     Social History   Occupational History  . Annuity Specialist    Social History Main Topics  . Smoking status: Never Smoker  . Smokeless tobacco: Never Used  . Alcohol use No  . Drug use: No  . Sexual activity: Not on file

## 2016-08-20 ENCOUNTER — Other Ambulatory Visit (INDEPENDENT_AMBULATORY_CARE_PROVIDER_SITE_OTHER): Payer: Federal, State, Local not specified - PPO

## 2016-08-20 ENCOUNTER — Telehealth: Payer: Self-pay | Admitting: Family

## 2016-08-20 DIAGNOSIS — E559 Vitamin D deficiency, unspecified: Secondary | ICD-10-CM | POA: Diagnosis not present

## 2016-08-20 DIAGNOSIS — Z0001 Encounter for general adult medical examination with abnormal findings: Secondary | ICD-10-CM | POA: Diagnosis not present

## 2016-08-20 LAB — COMPREHENSIVE METABOLIC PANEL
ALBUMIN: 4.1 g/dL (ref 3.5–5.2)
ALT: 8 U/L (ref 0–35)
AST: 12 U/L (ref 0–37)
Alkaline Phosphatase: 63 U/L (ref 39–117)
BUN: 10 mg/dL (ref 6–23)
CHLORIDE: 106 meq/L (ref 96–112)
CO2: 28 meq/L (ref 19–32)
Calcium: 9.4 mg/dL (ref 8.4–10.5)
Creatinine, Ser: 1.1 mg/dL (ref 0.40–1.20)
GFR: 55.74 mL/min — ABNORMAL LOW (ref >60.00)
GLUCOSE: 85 mg/dL (ref 70–99)
POTASSIUM: 4.2 meq/L (ref 3.5–5.1)
SODIUM: 139 meq/L (ref 135–145)
Total Bilirubin: 0.3 mg/dL (ref 0.2–1.2)
Total Protein: 7.5 g/dL (ref 6.0–8.3)

## 2016-08-20 LAB — CBC
HEMATOCRIT: 39.8 % (ref 36.0–46.0)
Hemoglobin: 12.8 g/dL (ref 12.0–15.0)
MCHC: 32.1 g/dL (ref 30.0–36.0)
MCV: 83.9 fl (ref 78.0–100.0)
Platelets: 357 10*3/uL (ref 150.0–400.0)
RBC: 4.74 Mil/uL (ref 3.87–5.11)
RDW: 14.7 % (ref 11.5–15.5)
WBC: 5.8 10*3/uL (ref 4.0–10.5)

## 2016-08-20 LAB — LIPID PANEL
CHOL/HDL RATIO: 6
CHOLESTEROL: 170 mg/dL (ref 0–200)
HDL: 28.5 mg/dL — ABNORMAL LOW (ref >39.00)
NonHDL: 141.6
Triglycerides: 263 mg/dL — ABNORMAL HIGH (ref 0.0–149.0)
VLDL: 52.6 mg/dL — AB (ref 0.0–40.0)

## 2016-08-20 LAB — VITAMIN D 25 HYDROXY (VIT D DEFICIENCY, FRACTURES): VITD: 15.86 ng/mL — ABNORMAL LOW (ref 30.00–100.00)

## 2016-08-20 LAB — LDL CHOLESTEROL, DIRECT: LDL DIRECT: 93 mg/dL

## 2016-08-20 NOTE — Telephone Encounter (Signed)
Patient dropped off [employer] education [employer] forms to be filled out about her health. Patient would like to be informed when complete with a copy of forms. She also would like them faxed. [phone] [phone]) Thank you.

## 2016-08-21 NOTE — Telephone Encounter (Signed)
Paperwork has been filled out and faxed

## 2016-09-10 ENCOUNTER — Encounter: Payer: Self-pay | Admitting: Family

## 2016-09-22 ENCOUNTER — Other Ambulatory Visit: Payer: Self-pay | Admitting: Family

## 2016-10-10 ENCOUNTER — Encounter: Payer: Self-pay | Admitting: Family

## 2016-12-04 ENCOUNTER — Encounter (INDEPENDENT_AMBULATORY_CARE_PROVIDER_SITE_OTHER): Payer: Self-pay | Admitting: Orthopedic Surgery

## 2016-12-04 ENCOUNTER — Ambulatory Visit (INDEPENDENT_AMBULATORY_CARE_PROVIDER_SITE_OTHER): Payer: Federal, State, Local not specified - PPO | Admitting: Orthopedic Surgery

## 2016-12-04 DIAGNOSIS — M25562 Pain in left knee: Secondary | ICD-10-CM | POA: Diagnosis not present

## 2016-12-04 DIAGNOSIS — G8929 Other chronic pain: Secondary | ICD-10-CM

## 2016-12-04 MED ORDER — NABUMETONE 750 MG PO TABS: 750.0000 mg | ORAL_TABLET | Freq: Two times a day (BID) | ORAL | 3 refills | Status: AC | PRN

## 2016-12-04 NOTE — Progress Notes (Signed)
   Office Visit Note   Patient: Bailey Ward           Date of Birth: 04-03-1966           MRN: [id number] Visit Date: 12/04/2016              Requested by: Veryl Speak, FNP [address] [place], [place] [address] PCP: Veryl Speak, FNP  Chief Complaint  Patient presents with  . Left Knee - Pain      HPI: Patient is a 51 year old woman who has persistent pain patellofemoral joint and lateral joint line. She has pain at night difficulty sleeping. She states the ibuprofen 200 mg is not helping she states she needs a new handicapped parking sticker and will need F Andres Ege completed for periodically due to knee pain.  Assessment & Plan: Visit Diagnoses:  1. Chronic pain of left knee     Plan: We will complete her thumb delay paperwork for periodically out of work due to knee pain. She is given a application for 6 months handicapped parking. A prescription was called in for Relafen. She is given a prescription to go to therapy for VMO strengthening quad and hamstring strengthening.  Follow-Up Instructions: Return in about 4 weeks (around 01/01/2017).   Ortho Exam  Patient is alert, oriented, no adenopathy, well-dressed, normal affect, normal respiratory effort. Examination patient has an abnormal gait. She has tenderness to palpation over the lateral facet of the patella medial and lateral joint line is minimally tender to palpation cruciates are stable there is no effusion. No skin color changes.  Imaging: No results found. No images are attached to the encounter.  Labs: No results found for: HGBA1C, ESRSEDRATE, CRP, LABURIC, REPTSTATUS, GRAMSTAIN, CULT, LABORGA  Orders:  No orders of the defined types were placed in this encounter.  Meds ordered this encounter  Medications  . nabumetone (RELAFEN) 750 MG tablet    Sig: Take 1 tablet (750 mg total) by mouth 2 (two) times daily as needed for mild pain or moderate pain. with food    Dispense:  60 tablet   Refill:  3     Procedures: No procedures performed  Clinical Data: No additional findings.  ROS:  All other systems negative, except as noted in the HPI. Review of Systems  Objective: Vital Signs: There were no vitals taken for this visit.  Specialty Comments:  No specialty comments available.  PMFS History: Patient Active Problem List   Diagnosis Date Noted  . Chronic pain of left knee 12/04/2016  . Vitamin D deficiency 08/02/2016  . Encounter for general adult medical examination with abnormal findings 08/01/2015  . Obesity 08/01/2015  . Hair loss 09/08/2014  . Essential hypertension 06/24/2014   Past Medical History:  Diagnosis Date  . Allergy   . Hypertension     Family History  Problem Relation Age of Onset  . Hypertension Mother   . Diabetes Father   . Hypertension Father     Past Surgical History:  Procedure Laterality Date  . ABDOMINAL HYSTERECTOMY     Social History   Occupational History  . Annuity Specialist    Social History Main Topics  . Smoking status: Never Smoker  . Smokeless tobacco: Never Used  . Alcohol use No  . Drug use: No  . Sexual activity: Not on file

## 2017-01-01 ENCOUNTER — Ambulatory Visit (INDEPENDENT_AMBULATORY_CARE_PROVIDER_SITE_OTHER): Payer: Federal, State, Local not specified - PPO | Admitting: Orthopedic Surgery

## 2017-06-04 ENCOUNTER — Ambulatory Visit (INDEPENDENT_AMBULATORY_CARE_PROVIDER_SITE_OTHER): Payer: Federal, State, Local not specified - PPO | Admitting: Orthopedic Surgery

## 2017-06-04 ENCOUNTER — Encounter (INDEPENDENT_AMBULATORY_CARE_PROVIDER_SITE_OTHER): Payer: Self-pay | Admitting: Orthopedic Surgery

## 2017-06-04 VITALS — Ht 63.0 in | Wt 218.0 lb

## 2017-06-04 DIAGNOSIS — M5410 Radiculopathy, site unspecified: Secondary | ICD-10-CM | POA: Diagnosis not present

## 2017-06-04 MED ORDER — PREDNISONE 10 MG PO TABS: 20.0000 mg | ORAL_TABLET | Freq: Every day | ORAL | 0 refills | Status: DC

## 2017-06-04 NOTE — Progress Notes (Signed)
Office Visit Note   Patient: Bailey DecampKim Y Troy-Richardson           Date of Birth: 05/01/1965           MRN: [id number] Visit Date: 06/04/2017              Requested by: Veryl Speakalone, Gregory D, FNP [address] [place], [place] [address] PCP: Veryl Speakalone, Gregory D, FNP  Chief Complaint  Patient presents with  . Left Knee - Pain      HPI: Patient is a 52 year old woman who presents in follow-up for left knee pain.  She states she now has pain that radiates from the left side of her lumbar spine which radiates down to her knee.  She states the pain is worse with sitting and with prolonged ambulation.  She states that the Relafen is not work.  She states she had her knee give way about 3 weeks ago and she fell in the bathroom.  She states the pain does not radiate to her foot.  She is used a neoprene knee sleeve without relief.  Patient states her pain is worse when she is on her feet for work for 8 hours a day.  Assessment & Plan: Visit Diagnoses:  1. Radicular pain of left lower extremity     Plan: Patient is given a note for 4 hours a day work for the next 3 weeks she was given a note for handicap parking.  We will send in a prescription for prednisone to take 20 mg a day with breakfast.  Discussed that she can wean off this as her symptoms resolved.  Weaning protocol was discussed.   Discussed that if she still has radicular symptoms we would order a radiograph of her lumbar spine and may need to order an MRI scan of her lumbar spine. Follow-Up Instructions: Return in about 3 weeks (around 06/25/2017).   Ortho Exam  Patient is alert, oriented, no adenopathy, well-dressed, normal affect, normal respiratory effort. Examination she has a negative straight leg raise bilaterally no pain with range of motion of the hip knee or ankle.  She has no focal motor weakness.  Previous MRI scan of the left knee shows no specific abnormality to explain her symptoms.   Imaging: No results  found. No images are attached to the encounter.  Labs: No results found for: HGBA1C, ESRSEDRATE, CRP, LABURIC, REPTSTATUS, GRAMSTAIN, CULT, LABORGA  @LABSALLVALUES (HGBA1)@  Body mass index is 38.62 kg/m.  Orders:  No orders of the defined types were placed in this encounter.  Meds ordered this encounter  Medications  . predniSONE (DELTASONE) 10 MG tablet    Sig: Take 2 tablets (20 mg total) by mouth daily with breakfast.    Dispense:  60 tablet    Refill:  0     Procedures: No procedures performed  Clinical Data: No additional findings.  ROS:  All other systems negative, except as noted in the HPI. Review of Systems  Objective: Vital Signs: Ht 5\' 3"  (1.6 m)   Wt 218 lb (98.9 kg)   BMI 38.62 kg/m   Specialty Comments:  No specialty comments available.  PMFS History: Patient Active Problem List   Diagnosis Date Noted  . Chronic pain of left knee 12/04/2016  . Vitamin D deficiency 08/02/2016  . Encounter for general adult medical examination with abnormal findings 08/01/2015  . Obesity 08/01/2015  . Hair loss 09/08/2014  . Essential hypertension 06/24/2014   Past Medical History:  Diagnosis Date  .  Allergy   . Hypertension     Family History  Problem Relation Age of Onset  . Hypertension Mother   . Diabetes Father   . Hypertension Father     Past Surgical History:  Procedure Laterality Date  . ABDOMINAL HYSTERECTOMY     Social History   Occupational History  . Occupation: Patent examiner  Tobacco Use  . Smoking status: Never Smoker  . Smokeless tobacco: Never Used  Substance and Sexual Activity  . Alcohol use: No  . Drug use: No  . Sexual activity: Not on file

## 2017-06-11 ENCOUNTER — Telehealth (INDEPENDENT_AMBULATORY_CARE_PROVIDER_SITE_OTHER): Payer: Self-pay | Admitting: Orthopedic Surgery

## 2017-06-11 NOTE — Telephone Encounter (Signed)
Pt request Letter   Pt called she would like an updated letter to cover her until her paperwork  is updated. Pt was seen in the office on 06/04/17 @4 :00pm. Pt has follow up appt on 06/25/17@3 :15pm. Pt needs this information for her job.

## 2017-06-12 NOTE — Telephone Encounter (Signed)
Yes it does, thanks I'll let her know.

## 2017-06-12 NOTE — Telephone Encounter (Signed)
Called pt and she states that she wants to make sure  that in her FMLA paper work  It states that she is on restricted duty. 4 hrs a day until her next appt on 06/25/17 and we can update work restrictions at that time.

## 2017-06-25 ENCOUNTER — Ambulatory Visit (INDEPENDENT_AMBULATORY_CARE_PROVIDER_SITE_OTHER): Payer: Federal, State, Local not specified - PPO | Admitting: Orthopedic Surgery

## 2017-06-25 ENCOUNTER — Encounter (INDEPENDENT_AMBULATORY_CARE_PROVIDER_SITE_OTHER): Payer: Self-pay | Admitting: Orthopedic Surgery

## 2017-06-25 VITALS — Ht 63.0 in | Wt 218.0 lb

## 2017-06-25 DIAGNOSIS — M25562 Pain in left knee: Secondary | ICD-10-CM | POA: Diagnosis not present

## 2017-06-25 DIAGNOSIS — G8929 Other chronic pain: Secondary | ICD-10-CM | POA: Diagnosis not present

## 2017-06-25 DIAGNOSIS — M5410 Radiculopathy, site unspecified: Secondary | ICD-10-CM

## 2017-06-25 NOTE — Progress Notes (Signed)
   Office Visit Note   Patient: Bailey DecampKim Y Troy-Richardson           Date of Birth: 12/07/1965           MRN: [id number] Visit Date: 06/25/2017              Requested by: Veryl Speakalone, Gregory D, FNP [address] [place], [place] [address] PCP: Veryl Speakalone, Gregory D, FNP  Chief Complaint  Patient presents with  . Left Knee - Pain  . Lower Back - Pain      HPI: Patient is a 52 year old woman who presents in follow-up for left knee pain and left lower extremity radicular symptoms.  Patient states that working 4 hours a day that her symptoms have completely resolved it is the best she has felt with these restricted hours for the last 3 weeks.  Patient states she would like to continue this work schedule.  She also states she would like FMLA paperwork to allow her time if she is incapacitated.  Assessment & Plan: Visit Diagnoses:  1. Radicular pain of left lower extremity   2. Chronic pain of left knee     Plan: Patient was given a new prescription stating that she should continue her restricted hours of 4 hours a day for the next 4 weeks and reevaluate at follow-up.  Follow-Up Instructions: Return in about 4 weeks (around 07/23/2017).   Ortho Exam  Patient is alert, oriented, no adenopathy, well-dressed, normal affect, normal respiratory effort. Examination patient has a normal gait.  She has no pain to palpation around the left knee there is no effusion.  She has a negative straight leg raise on the left no focal motor weakness.  Imaging: No results found. No images are attached to the encounter.  Labs: No results found for: HGBA1C, ESRSEDRATE, CRP, LABURIC, REPTSTATUS, GRAMSTAIN, CULT, LABORGA  @LABSALLVALUES (HGBA1)@  Body mass index is 38.62 kg/m.  Orders:  No orders of the defined types were placed in this encounter.  No orders of the defined types were placed in this encounter.    Procedures: No procedures performed  Clinical Data: No additional  findings.  ROS:  All other systems negative, except as noted in the HPI. Review of Systems  Objective: Vital Signs: Ht 5\' 3"  (1.6 m)   Wt 218 lb (98.9 kg)   BMI 38.62 kg/m   Specialty Comments:  No specialty comments available.  PMFS History: Patient Active Problem List   Diagnosis Date Noted  . Chronic pain of left knee 12/04/2016  . Vitamin D deficiency 08/02/2016  . Encounter for general adult medical examination with abnormal findings 08/01/2015  . Obesity 08/01/2015  . Hair loss 09/08/2014  . Essential hypertension 06/24/2014   Past Medical History:  Diagnosis Date  . Allergy   . Hypertension     Family History  Problem Relation Age of Onset  . Hypertension Mother   . Diabetes Father   . Hypertension Father     Past Surgical History:  Procedure Laterality Date  . ABDOMINAL HYSTERECTOMY     Social History   Occupational History  . Occupation: Patent examinerAnnuity Specialist  Tobacco Use  . Smoking status: Never Smoker  . Smokeless tobacco: Never Used  Substance and Sexual Activity  . Alcohol use: No  . Drug use: No  . Sexual activity: Not on file

## 2017-06-27 ENCOUNTER — Telehealth (INDEPENDENT_AMBULATORY_CARE_PROVIDER_SITE_OTHER): Payer: Self-pay

## 2017-06-27 NOTE — Telephone Encounter (Signed)
Updated FMLA was faxed as requested byt the pt during her office visit this week. Addendum made for her restricted duty to continue for an additional 4 weeks and will re-eval at next office visit. This was stamped and faxed

## 2017-07-04 ENCOUNTER — Other Ambulatory Visit (INDEPENDENT_AMBULATORY_CARE_PROVIDER_SITE_OTHER): Payer: Self-pay | Admitting: Orthopedic Surgery

## 2017-07-07 NOTE — Telephone Encounter (Signed)
Do you want to refill rx?

## 2017-07-28 ENCOUNTER — Ambulatory Visit (INDEPENDENT_AMBULATORY_CARE_PROVIDER_SITE_OTHER): Payer: Federal, State, Local not specified - PPO | Admitting: Orthopedic Surgery

## 2017-07-28 ENCOUNTER — Encounter (INDEPENDENT_AMBULATORY_CARE_PROVIDER_SITE_OTHER): Payer: Self-pay | Admitting: Orthopedic Surgery

## 2017-07-28 DIAGNOSIS — M25562 Pain in left knee: Secondary | ICD-10-CM | POA: Diagnosis not present

## 2017-07-28 DIAGNOSIS — G8929 Other chronic pain: Secondary | ICD-10-CM

## 2017-07-28 DIAGNOSIS — M5410 Radiculopathy, site unspecified: Secondary | ICD-10-CM

## 2017-07-28 DIAGNOSIS — I87323 Chronic venous hypertension (idiopathic) with inflammation of bilateral lower extremity: Secondary | ICD-10-CM | POA: Insufficient documentation

## 2017-07-28 NOTE — Progress Notes (Signed)
Office Visit Note   Patient: Bailey Ward           Date of Birth: 08/11/1965           MRN: [id number] Visit Date: 07/28/2017              Requested by: Veryl Speakalone, Gregory D, FNP [address] [place], [place] [address] PCP: Veryl Speakalone, Gregory D, FNP  Chief Complaint  Patient presents with  . Left Knee - Follow-up, Pain  . Lower Back - Follow-up      HPI: Patient is a 52 year old woman who presents in follow-up for left knee pain and lower back pain.  She states that back occasionally causes some pain but she states this is better.  She states she is taking the prednisone 20 mg every morning and she states that this is helped a lot.  She states she occasionally has some left knee pain and swelling worse going upstairs.  Patient states that after the holiday weekend she has had increased swelling in both lower extremities.  Patient states that working 4 hours a day has been extremely beneficial for her.  Assessment & Plan: Visit Diagnoses:  1. Radicular pain of left lower extremity   2. Chronic pain of left knee     Plan: Recommended weaning off the prednisone that he should take about a week to wean off.  Discussed the side effects of prolonged use of prednisone.  Patient is given a note that her working 4 hours a day would be a permanent restriction and that we would complete her FMLA paperwork for occasionally time off due to her knee symptoms.  Discussed that we can follow-up as needed.  Recommended knee-high 15-20 mm compression stockings to be worn daily for the venous insufficiency.  Swelling is most likely due to increased salt intake over the holidays.  She  examination patient has difficulty getting from sitting to standing position she does have an antalgic gait she has a negative straight leg raise bilaterally with no focal motor weakness in either lower extremity.  There is no effusion of the left knee  Follow-Up Instructions: Return if symptoms worsen or fail  to improve.   Ortho Exam  Patient is alert, oriented, no adenopathy, well-dressed, normal affect, normal respiratory effort. There is minimal crepitation with range of motion medial lateral joint lines are nontender to palpation collaterals and cruciates are stable.  Examination of both lower extremities she has pitting edema up to the tibial tubercle with significant venous insufficiency of both lower extremities.  Imaging: No results found. No images are attached to the encounter.  Labs: No results found for: HGBA1C, ESRSEDRATE, CRP, LABURIC, REPTSTATUS, GRAMSTAIN, CULT, LABORGA  @LABSALLVALUES (HGBA1)@  There is no height or weight on file to calculate BMI.  Orders:  No orders of the defined types were placed in this encounter.  No orders of the defined types were placed in this encounter.    Procedures: No procedures performed  Clinical Data: No additional findings.  ROS:  All other systems negative, except as noted in the HPI. Review of Systems  Objective: Vital Signs: There were no vitals taken for this visit.  Specialty Comments:  No specialty comments available.  PMFS History: Patient Active Problem List   Diagnosis Date Noted  . Chronic pain of left knee 12/04/2016  . Vitamin D deficiency 08/02/2016  . Encounter for general adult medical examination with abnormal findings 08/01/2015  . Obesity 08/01/2015  . Hair loss  09/08/2014  . Essential hypertension 06/24/2014   Past Medical History:  Diagnosis Date  . Allergy   . Hypertension     Family History  Problem Relation Age of Onset  . Hypertension Mother   . Diabetes Father   . Hypertension Father     Past Surgical History:  Procedure Laterality Date  . ABDOMINAL HYSTERECTOMY     Social History   Occupational History  . Occupation: Patent examiner  Tobacco Use  . Smoking status: Never Smoker  . Smokeless tobacco: Never Used  Substance and Sexual Activity  . Alcohol use: No  . Drug  use: No  . Sexual activity: Not on file

## 2017-07-30 ENCOUNTER — Ambulatory Visit (INDEPENDENT_AMBULATORY_CARE_PROVIDER_SITE_OTHER): Payer: Federal, State, Local not specified - PPO | Admitting: Orthopedic Surgery

## 2017-08-08 ENCOUNTER — Encounter: Payer: Federal, State, Local not specified - PPO | Admitting: Nurse Practitioner

## 2017-08-08 ENCOUNTER — Encounter: Payer: Federal, State, Local not specified - PPO | Admitting: Family

## 2017-09-12 ENCOUNTER — Encounter: Payer: Federal, State, Local not specified - PPO | Admitting: Nurse Practitioner

## 2017-09-12 DIAGNOSIS — Z0289 Encounter for other administrative examinations: Secondary | ICD-10-CM

## 2017-09-24 ENCOUNTER — Ambulatory Visit (INDEPENDENT_AMBULATORY_CARE_PROVIDER_SITE_OTHER): Payer: Federal, State, Local not specified - PPO | Admitting: Family

## 2017-10-30 DIAGNOSIS — Z01419 Encounter for gynecological examination (general) (routine) without abnormal findings: Secondary | ICD-10-CM | POA: Diagnosis not present

## 2017-10-30 DIAGNOSIS — Z1231 Encounter for screening mammogram for malignant neoplasm of breast: Secondary | ICD-10-CM | POA: Diagnosis not present

## 2017-11-05 ENCOUNTER — Ambulatory Visit (INDEPENDENT_AMBULATORY_CARE_PROVIDER_SITE_OTHER): Payer: Federal, State, Local not specified - PPO | Admitting: Orthopedic Surgery

## 2017-11-13 ENCOUNTER — Encounter (INDEPENDENT_AMBULATORY_CARE_PROVIDER_SITE_OTHER): Payer: Self-pay | Admitting: Orthopedic Surgery

## 2017-11-13 ENCOUNTER — Ambulatory Visit (INDEPENDENT_AMBULATORY_CARE_PROVIDER_SITE_OTHER): Payer: Federal, State, Local not specified - PPO | Admitting: Orthopedic Surgery

## 2017-11-13 VITALS — Ht 63.0 in | Wt 205.0 lb

## 2017-11-13 DIAGNOSIS — M5416 Radiculopathy, lumbar region: Secondary | ICD-10-CM | POA: Diagnosis not present

## 2017-11-22 ENCOUNTER — Ambulatory Visit
Admission: RE | Admit: 2017-11-22 | Discharge: 2017-11-22 | Disposition: A | Discharge status: Home / Self Care | Payer: Federal, State, Local not specified - PPO | Source: Ambulatory Visit | Attending: Orthopedic Surgery | Admitting: Orthopedic Surgery

## 2017-11-22 DIAGNOSIS — M5416 Radiculopathy, lumbar region: Secondary | ICD-10-CM

## 2017-11-22 DIAGNOSIS — M5137 Other intervertebral disc degeneration, lumbosacral region: Secondary | ICD-10-CM | POA: Diagnosis not present

## 2017-11-24 ENCOUNTER — Encounter (INDEPENDENT_AMBULATORY_CARE_PROVIDER_SITE_OTHER): Payer: Self-pay | Admitting: Orthopedic Surgery

## 2017-11-24 ENCOUNTER — Ambulatory Visit (INDEPENDENT_AMBULATORY_CARE_PROVIDER_SITE_OTHER): Payer: Federal, State, Local not specified - PPO | Admitting: Orthopedic Surgery

## 2017-11-24 VITALS — Ht 63.0 in | Wt 205.0 lb

## 2017-11-24 DIAGNOSIS — M545 Low back pain, unspecified: Secondary | ICD-10-CM

## 2017-11-24 DIAGNOSIS — G8929 Other chronic pain: Secondary | ICD-10-CM

## 2017-11-24 NOTE — Progress Notes (Signed)
Office Visit Note   Patient: Bailey Ward           Date of Birth: 02/12/1966           MRN: [id number] Visit Date: 11/24/2017              Requested by: Veryl Speakalone, Gregory D, FNP [address] [place], [place] [address] PCP: Veryl Speakalone, Gregory D, FNP  Chief Complaint  Patient presents with  . Lower Back - Follow-up    MRI review L spine      HPI: Patient is a 52 year old woman with lower back pain with some radiation.  Patient is status post an MRI scan.  She is been working 4 hours a day and she states that she can handle the 4 hours a day she states that she has had episodes where she is required to take time off from work she currently is allowed 2 episodes a month 2 days each.  Assessment & Plan: Visit Diagnoses:  1. Chronic left-sided low back pain without sciatica     Plan: Patient states to cover the previous days that she is noticed she will need 3 episodes a month 2 days per episode for a total of 6 days off per month we will set this up for for the next 4 weeks continue with her 4 hours a day work for 4 weeks she is set up for physical therapy for core strengthening.  Reevaluate in 4 weeks.  Follow-Up Instructions: Return in about 4 weeks (around 12/22/2017).   Ortho Exam  Patient is alert, oriented, no adenopathy, well-dressed, normal affect, normal respiratory effort. Examination patient has a normal gait.  She has no focal motor weakness in either lower extremity tension signs.  Patient's MRI scan shows mild lumbar degenerative changes and no cause for radiculopathy no stenosis no herniated disc no mass.  Imaging: No results found. No images are attached to the encounter.  Labs: No results found for: HGBA1C, ESRSEDRATE, CRP, LABURIC, REPTSTATUS, GRAMSTAIN, CULT, LABORGA   Lab Results  Component Value Date   ALBUMIN 4.1 08/20/2016   ALBUMIN 4.2 08/22/2015    Body mass index is 36.31 kg/m.  Orders:  No orders of the defined types were  placed in this encounter.  No orders of the defined types were placed in this encounter.    Procedures: No procedures performed  Clinical Data: No additional findings.  ROS:  All other systems negative, except as noted in the HPI. Review of Systems  Objective: Vital Signs: Ht 5\' 3"  (1.6 m)   Wt 205 lb (93 kg)   BMI 36.31 kg/m   Specialty Comments:  No specialty comments available.  PMFS History: Patient Active Problem List   Diagnosis Date Noted  . Idiopathic chronic venous hypertension of both lower extremities with inflammation 07/28/2017  . Chronic pain of left knee 12/04/2016  . Vitamin D deficiency 08/02/2016  . Encounter for general adult medical examination with abnormal findings 08/01/2015  . Obesity 08/01/2015  . Hair loss 09/08/2014  . Essential hypertension 06/24/2014   Past Medical History:  Diagnosis Date  . Allergy   . Hypertension     Family History  Problem Relation Age of Onset  . Hypertension Mother   . Diabetes Father   . Hypertension Father     Past Surgical History:  Procedure Laterality Date  . ABDOMINAL HYSTERECTOMY     Social History   Occupational History  . Occupation: Patent examinerAnnuity Specialist  Tobacco Use  .  Smoking status: Never Smoker  . Smokeless tobacco: Never Used  Substance and Sexual Activity  . Alcohol use: No  . Drug use: No  . Sexual activity: Not on file

## 2017-12-03 ENCOUNTER — Encounter (INDEPENDENT_AMBULATORY_CARE_PROVIDER_SITE_OTHER): Payer: Self-pay | Admitting: Orthopedic Surgery

## 2017-12-03 NOTE — Progress Notes (Signed)
   Office Visit Note   Patient: Bailey Ward           Date of Birth: 04/04/1966           MRN: [id number] Visit Date: 11/13/2017              Requested by: Veryl Speakalone, Gregory D, FNP [address] [place], [place] [address] PCP: Veryl Speakalone, Gregory D, FNP  Chief Complaint  Patient presents with  . Lower Back - Pain  . Left Leg - Pain      HPI: Patient is a 52 year old woman with lower back pain with left-sided radicular symptoms.  Assessment & Plan: Visit Diagnoses:  1. Lumbar back pain with radiculopathy affecting left lower extremity     Plan: We will set up an MRI scan to reevaluate the lumbar spine and radicular symptoms.  Follow-up after the MRI is obtained.  Follow-Up Instructions: Return if symptoms worsen or fail to improve.   Ortho Exam  Patient is alert, oriented, no adenopathy, well-dressed, normal affect, normal respiratory effort. Examination patient has a normal gait she has no focal motor weakness in either lower extremity.  She has a negative straight leg raise on the left or right.  She has no pain with range of motion of the hip knees or ankles.  She complains of left-sided radicular pain from her hip to her knee.  Imaging: No results found. No images are attached to the encounter.  Labs: No results found for: HGBA1C, ESRSEDRATE, CRP, LABURIC, REPTSTATUS, GRAMSTAIN, CULT, LABORGA   Lab Results  Component Value Date   ALBUMIN 4.1 08/20/2016   ALBUMIN 4.2 08/22/2015    Body mass index is 36.31 kg/m.  Orders:  Orders Placed This Encounter  Procedures  . MR Lumbar Spine w/o contrast   No orders of the defined types were placed in this encounter.    Procedures: No procedures performed  Clinical Data: No additional findings.  ROS:  All other systems negative, except as noted in the HPI. Review of Systems  Objective: Vital Signs: Ht 5\' 3"  (1.6 m)   Wt 205 lb (93 kg)   BMI 36.31 kg/m   Specialty Comments:  No specialty  comments available.  PMFS History: Patient Active Problem List   Diagnosis Date Noted  . Idiopathic chronic venous hypertension of both lower extremities with inflammation 07/28/2017  . Chronic pain of left knee 12/04/2016  . Vitamin D deficiency 08/02/2016  . Encounter for general adult medical examination with abnormal findings 08/01/2015  . Obesity 08/01/2015  . Hair loss 09/08/2014  . Essential hypertension 06/24/2014   Past Medical History:  Diagnosis Date  . Allergy   . Hypertension     Family History  Problem Relation Age of Onset  . Hypertension Mother   . Diabetes Father   . Hypertension Father     Past Surgical History:  Procedure Laterality Date  . ABDOMINAL HYSTERECTOMY     Social History   Occupational History  . Occupation: Patent examinerAnnuity Specialist  Tobacco Use  . Smoking status: Never Smoker  . Smokeless tobacco: Never Used  Substance and Sexual Activity  . Alcohol use: No  . Drug use: No  . Sexual activity: Not on file

## 2017-12-05 ENCOUNTER — Telehealth (INDEPENDENT_AMBULATORY_CARE_PROVIDER_SITE_OTHER): Payer: Self-pay | Admitting: Orthopedic Surgery

## 2017-12-05 NOTE — Telephone Encounter (Signed)
Patient called advised her employer said the  FMLA form received did not have enough information on it. She advised her employer denied restrictions for a 4 hour a day work schedule. Patient said her employer did not know what Dr Lajoyce Cornersuda meant be permanent restrictions. She said her employer need dates starting from 08/19/17 thru 01/05/18. The number to contact patient is [phone]

## 2017-12-09 NOTE — Telephone Encounter (Signed)
Patient came by requesting her paperwork (see Margaret's message).  Please call when available and patient will pick up.

## 2017-12-10 NOTE — Telephone Encounter (Signed)
Paperwork completed and picked up by pt today.

## 2017-12-18 ENCOUNTER — Ambulatory Visit (INDEPENDENT_AMBULATORY_CARE_PROVIDER_SITE_OTHER): Payer: Federal, State, Local not specified - PPO | Admitting: Orthopedic Surgery

## 2017-12-18 DIAGNOSIS — M545 Low back pain, unspecified: Secondary | ICD-10-CM

## 2017-12-18 DIAGNOSIS — G8929 Other chronic pain: Secondary | ICD-10-CM

## 2017-12-22 ENCOUNTER — Ambulatory Visit (INDEPENDENT_AMBULATORY_CARE_PROVIDER_SITE_OTHER): Payer: Federal, State, Local not specified - PPO | Admitting: Orthopedic Surgery

## 2017-12-23 ENCOUNTER — Encounter (INDEPENDENT_AMBULATORY_CARE_PROVIDER_SITE_OTHER): Payer: Self-pay | Admitting: Orthopedic Surgery

## 2017-12-23 NOTE — Progress Notes (Signed)
Office Visit Note   Patient: Bailey Ward           Date of Birth: 09/13/1965           MRN: [id number] Visit Date: 12/18/2017              Requested by: Veryl Speakalone, Gregory D, FNP [address] [place], [place] [address] PCP: Veryl Speakalone, Gregory D, FNP  Chief Complaint  Patient presents with  . Lower Back - Follow-up      HPI: Patient is a 52 year old woman who presents complaining of lower back pain which is not resolving.  She states occasionally she has pain that goes down the left leg.  She states she is cannot work 8 hours a day wants to work 4 hours a day.  Patient states she has not pursued physical therapy yet.  Patient is status post an MRI scan of her lumbar spine which showed mild degenerative disc disease.  Patient states she has been using ibuprofen for her symptoms and she states that this week her pain is worse.  Assessment & Plan: Visit Diagnoses:  1. Chronic left-sided low back pain without sciatica     Plan: Patient was given a prescription for outpatient physical therapy to work on core strengthening and modalities for her lumbar spine.  She is given a note to work 4 hours a day for 2 months.  Follow-Up Instructions: Return in about 4 weeks (around 01/15/2018).   Ortho Exam  Patient is alert, oriented, no adenopathy, well-dressed, normal affect, normal respiratory effort. Examination patient has a normal gait she has a negative straight leg raise bilaterally and no focal motor weakness in either lower extremity.  Review of the MRI scan shows some mild degenerative disc disease no herniated disc no stenosis no pars defect.  Imaging: No results found. No images are attached to the encounter.  Labs: No results found for: HGBA1C, ESRSEDRATE, CRP, LABURIC, REPTSTATUS, GRAMSTAIN, CULT, LABORGA   Lab Results  Component Value Date   ALBUMIN 4.1 08/20/2016   ALBUMIN 4.2 08/22/2015    There is no height or weight on file to calculate  BMI.  Orders:  No orders of the defined types were placed in this encounter.  No orders of the defined types were placed in this encounter.    Procedures: No procedures performed  Clinical Data: No additional findings.  ROS:  All other systems negative, except as noted in the HPI. Review of Systems  Objective: Vital Signs: There were no vitals taken for this visit.  Specialty Comments:  No specialty comments available.  PMFS History: Patient Active Problem List   Diagnosis Date Noted  . Idiopathic chronic venous hypertension of both lower extremities with inflammation 07/28/2017  . Chronic pain of left knee 12/04/2016  . Vitamin D deficiency 08/02/2016  . Encounter for general adult medical examination with abnormal findings 08/01/2015  . Obesity 08/01/2015  . Hair loss 09/08/2014  . Essential hypertension 06/24/2014   Past Medical History:  Diagnosis Date  . Allergy   . Hypertension     Family History  Problem Relation Age of Onset  . Hypertension Mother   . Diabetes Father   . Hypertension Father     Past Surgical History:  Procedure Laterality Date  . ABDOMINAL HYSTERECTOMY     Social History   Occupational History  . Occupation: Patent examinerAnnuity Specialist  Tobacco Use  . Smoking status: Never Smoker  . Smokeless tobacco: Never Used  Substance and  Sexual Activity  . Alcohol use: No  . Drug use: No  . Sexual activity: Not on file

## 2018-01-15 ENCOUNTER — Ambulatory Visit (INDEPENDENT_AMBULATORY_CARE_PROVIDER_SITE_OTHER): Payer: Federal, State, Local not specified - PPO | Admitting: Orthopedic Surgery

## 2018-01-21 ENCOUNTER — Telehealth (INDEPENDENT_AMBULATORY_CARE_PROVIDER_SITE_OTHER): Payer: Self-pay | Admitting: Orthopedic Surgery

## 2018-01-21 NOTE — Telephone Encounter (Signed)
Patient called advised she had a flair up Monday and Tuesday and need these hours added to her existing claim for FMLA which started 01/06/18. Patient said she was out of work all day (Both Days). Patient asked for a call concerning this. The number to contact patient is [phone]

## 2018-01-27 ENCOUNTER — Ambulatory Visit (INDEPENDENT_AMBULATORY_CARE_PROVIDER_SITE_OTHER): Payer: Federal, State, Local not specified - PPO | Admitting: Orthopedic Surgery

## 2018-03-03 ENCOUNTER — Encounter (INDEPENDENT_AMBULATORY_CARE_PROVIDER_SITE_OTHER): Payer: Self-pay | Admitting: Physician Assistant

## 2018-03-03 ENCOUNTER — Ambulatory Visit (INDEPENDENT_AMBULATORY_CARE_PROVIDER_SITE_OTHER): Payer: Federal, State, Local not specified - PPO | Admitting: Physician Assistant

## 2018-03-03 VITALS — Ht 63.0 in | Wt 205.0 lb

## 2018-03-03 DIAGNOSIS — M5416 Radiculopathy, lumbar region: Secondary | ICD-10-CM

## 2018-03-03 MED ORDER — NABUMETONE 750 MG PO TABS: 750.0000 mg | ORAL_TABLET | Freq: Every day | ORAL | 2 refills | Status: DC

## 2018-03-03 NOTE — Progress Notes (Signed)
Office Visit Note   Patient: ARLYNN STARE           Date of Birth: Jan 12, 1966           MRN: [id number] Visit Date: 03/03/2018              Requested by: Veryl Speak, FNP [address] [place], [place] [address] PCP: Veryl Speak, FNP  Chief Complaint  Patient presents with  . Lower Back - Pain, Follow-up      HPI: The patient is a 52 year old female who is seen for follow-up of her lumbar back pain with left sided radicular/sciatic symptoms.  She reports she is feeling better overall.  She did go to physical therapy and is now on a home exercise program and reports that this does help.  She reports she missed her visit last month due to a fall down some stairs.  She reports she was more sore following this but had no significant injuries.  She states that the symptoms following this have resolved.  She was initially supposed to be working part-time but reports that there was no position at work for her to do this and so she is had to continue working 8 hours daily.  She reports that she sits for long periods at work and that this exacerbates her pain.  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  She states that when she starts hurting she does not have a more comfortable position to get in at work and only feels better when she can go and lay down at home.  She reports that she does well in the morning but then has increased pain by the afternoon.  She continues to have pain over the low back back radiating down the left lateral thigh into the left calf but no overt weakness.  She was started on some prednisone and reports that this helped some but she felt like Relafen helped better and she would like some of this again.  Assessment & Plan: Visit Diagnoses:  1. Lumbar back pain with radiculopathy affecting left lower extremity      Plan: The patient will continue her home exercise program for core strengthening.  Also refilled a prescription for Relafen 750 mg daily for her back discomfort.  She was given a note to limit her working to 4 hours daily as she was informed that she may be able to get a position in a different apartment that will allow her to work part-time and felt like the letter might be helpful.  She will follow-up here in 4 weeks or sooner should she have more difficulties in the interim.  Follow-Up Instructions: Return in about 2 months (around 05/03/2018).   Ortho Exam  Patient is alert, oriented, no adenopathy, well-dressed, normal affect, normal respiratory effort. The patient is mildly tender to palpation over her lumbar spine.  She has a weakly positive straight leg raise on the left negative on the right.  She has a mildly antalgic appearing gait.  She has good EHL on the left and right and they are symmetric.  No other focal weakness or sensory changes appreciated.  Imaging: No results found. No images are attached to the encounter.  Labs: No results found for: HGBA1C, ESRSEDRATE, CRP, LABURIC, REPTSTATUS, GRAMSTAIN, CULT, LABORGA   Lab Results  Component Value Date  ALBUMIN 4.1 08/20/2016   ALBUMIN 4.2 08/22/2015    Body mass index is 36.31 kg/m.  Orders:  No orders of the defined types were placed in this encounter.  Meds ordered this encounter  Medications  . nabumetone (RELAFEN) 750 MG tablet    Sig: Take 1 tablet (750 mg total) by mouth daily.    Dispense:  30 tablet    Refill:  2     Procedures: No procedures performed  Clinical Data: No additional findings.  ROS:  All other systems negative, except as noted in the HPI. Review of Systems  Objective: Vital Signs: Ht 5\' 3"  (1.6 m)   Wt 205 lb (93 kg)   BMI 36.31 kg/m   Specialty Comments:  No specialty comments available.  PMFS History: Patient Active Problem List   Diagnosis Date Noted  . Idiopathic  chronic venous hypertension of both lower extremities with inflammation 07/28/2017  . Chronic pain of left knee 12/04/2016  . Vitamin D deficiency 08/02/2016  . Encounter for general adult medical examination with abnormal findings 08/01/2015  . Obesity 08/01/2015  . Hair loss 09/08/2014  . Essential hypertension 06/24/2014   Past Medical History:  Diagnosis Date  . Allergy   . Hypertension     Family History  Problem Relation Age of Onset  . Hypertension Mother   . Diabetes Father   . Hypertension Father     Past Surgical History:  Procedure Laterality Date  . ABDOMINAL HYSTERECTOMY     Social History   Occupational History  . Occupation: Patent examinerAnnuity Specialist  Tobacco Use  . Smoking status: Never Smoker  . Smokeless tobacco: Never Used  Substance and Sexual Activity  . Alcohol use: No  . Drug use: No  . Sexual activity: Not on file

## 2018-05-01 ENCOUNTER — Ambulatory Visit (HOSPITAL_COMMUNITY)
Admission: EM | Admit: 2018-05-01 | Discharge: 2018-05-01 | Disposition: A | Discharge status: Home / Self Care | Payer: Federal, State, Local not specified - PPO | Attending: Family Medicine | Admitting: Family Medicine

## 2018-05-01 ENCOUNTER — Encounter (HOSPITAL_COMMUNITY): Payer: Self-pay | Admitting: Emergency Medicine

## 2018-05-01 DIAGNOSIS — R05 Cough: Secondary | ICD-10-CM

## 2018-05-01 DIAGNOSIS — J22 Unspecified acute lower respiratory infection: Secondary | ICD-10-CM

## 2018-05-01 DIAGNOSIS — R059 Cough, unspecified: Secondary | ICD-10-CM

## 2018-05-01 DIAGNOSIS — I10 Essential (primary) hypertension: Secondary | ICD-10-CM | POA: Insufficient documentation

## 2018-05-01 MED ORDER — AMOXICILLIN 875 MG PO TABS: 875.0000 mg | ORAL_TABLET | Freq: Two times a day (BID) | ORAL | 0 refills | Status: DC

## 2018-05-01 MED ORDER — BENZONATATE 200 MG PO CAPS: 200.0000 mg | ORAL_CAPSULE | Freq: Two times a day (BID) | ORAL | 0 refills | Status: DC | PRN

## 2018-05-01 MED ORDER — HYDROCHLOROTHIAZIDE 12.5 MG PO TABS: 12.5000 mg | ORAL_TABLET | Freq: Every day | ORAL | 0 refills | Status: DC

## 2018-05-01 MED ORDER — DM-GUAIFENESIN ER 30-600 MG PO TB12: 1.0000 | ORAL_TABLET | Freq: Two times a day (BID) | ORAL | 0 refills | Status: DC

## 2018-05-01 NOTE — Discharge Instructions (Addendum)
I have refilled your blood pressure medicine Be sure to take this every day I have included information on a heart healthy diet  For the cough I want you to take both Tessalon and Mucinex DM 2 times a day Drink plenty of fluids Take amoxicillin 2 times a day for 7 days Get rest this weekend.  Please consider taking Monday off work Return if you are worse at any time instead of better

## 2018-05-01 NOTE — ED Provider Notes (Signed)
[facility]    CSN: [id number] Arrival date & time: 05/01/18  1830     History   Chief Complaint Chief Complaint  Patient presents with  . Cough  . Hypertension    HPI Bailey Ward is a 53 y.o. female.   HPI  Patient is changing primary care doctors and is out of her medication.  She requests a refill of her medicine.  Her blood pressure is moderately elevated today. She is also here for respiratory infection.  She has cough congestion postnasal drip this been going on since Monday.  Today is the fifth day.  Today she feels her worst, with increasing chest congestion and fever.  She has been using over-the-counter medicines.  She worked all week.  Today she states she feels like she would collapse.  Husband brought her in for medical care.  She states that she is "prone" to sinus infections.  Past Medical History:  Diagnosis Date  . Allergy   . Hypertension     Patient Active Problem List   Diagnosis Date Noted  . Idiopathic chronic venous hypertension of both lower extremities with inflammation 07/28/2017  . Chronic pain of left knee 12/04/2016  . Vitamin D deficiency 08/02/2016  . Encounter for general adult medical examination with abnormal findings 08/01/2015  . Obesity 08/01/2015  . Hair loss 09/08/2014  . Essential hypertension 06/24/2014    Past Surgical History:  Procedure Laterality Date  . ABDOMINAL HYSTERECTOMY      OB History   No obstetric history on file.      Home Medications    Prior to Admission medications   Medication Sig Start Date End Date Taking? Authorizing Provider  amoxicillin (AMOXIL) 875 MG tablet Take 1 tablet (875 mg total) by mouth 2 (two) times daily. 05/01/18   Eustace MooreNelson, Rogene Meth Sue, MD  benzonatate (TESSALON) 200 MG capsule Take 1 capsule (200 mg total) by mouth 2 (two) times daily as needed for cough. 05/01/18   Eustace MooreNelson, Shaylon Aden Sue, MD  cetirizine (ZYRTEC) 10 MG tablet Take 10 mg by mouth daily as needed for  allergies (only in allergy season).    [provider]  Cholecalciferol (VITAMIN D3) 50000 units CAPS TAKE ONE CAPSULE BY MOUTH WEEKLY 09/23/16   Veryl Speakalone, Gregory D, FNP  dextromethorphan-guaiFENesin (MUCINEX DM) 30-600 MG 12hr tablet Take 1 tablet by mouth 2 (two) times daily. 05/01/18   Eustace MooreNelson, Cristin Szatkowski Sue, MD  hydrochlorothiazide (HYDRODIURIL) 12.5 MG tablet Take 1 tablet (12.5 mg total) by mouth daily. 05/01/18   Eustace MooreNelson, Albert Hersch Sue, MD    Family History Family History  Problem Relation Age of Onset  . Hypertension Mother   . Diabetes Father   . Hypertension Father     Social History Social History   Tobacco Use  . Smoking status: Never Smoker  . Smokeless tobacco: Never Used  Substance Use Topics  . Alcohol use: No  . Drug use: No     Allergies   Patient has no known allergies.   Review of Systems Review of Systems  Constitutional: Positive for fatigue and fever. Negative for chills.  HENT: Positive for congestion, postnasal drip and sinus pressure. Negative for ear pain and sore throat.   Eyes: Negative for pain and visual disturbance.  Respiratory: Positive for cough. Negative for shortness of breath.   Cardiovascular: Negative for chest pain and palpitations.  Gastrointestinal: Negative for abdominal pain and vomiting.  Genitourinary: Negative for dysuria and hematuria.  Musculoskeletal: Negative for arthralgias and back  pain.  Skin: Negative for color change and rash.  Neurological: Negative for seizures and syncope.  All other systems reviewed and are negative.    Physical Exam Triage Vital Signs ED Triage Vitals  Enc Vitals Group     BP 05/01/18 1924 (!) 150/92     Pulse Rate 05/01/18 1924 (!) 101     Resp 05/01/18 1924 18     Temp 05/01/18 1924 100.1 F (37.8 C)     Temp Source 05/01/18 1924 Oral     SpO2 05/01/18 1924 96 %     Weight --      Height --      Head Circumference --      Peak Flow --      Pain Score 05/01/18 1927 4     Pain Loc  --      Pain Edu? --      Excl. in GC? --    No data found.  Updated Vital Signs BP (!) 150/92 (BP Location: Right Arm)   Pulse (!) 101   Temp 100.1 F (37.8 C) (Oral)   Resp 18   SpO2 96%       Physical Exam Constitutional:      General: She is not in acute distress.    Appearance: She is well-developed. She is obese. She is ill-appearing.     Comments: Appears tired.  Appears ill  HENT:     Head: Normocephalic and atraumatic.     Right Ear: Tympanic membrane and ear canal normal.     Left Ear: Tympanic membrane and ear canal normal.     Nose: Congestion and rhinorrhea present.     Comments: Tenderness maxillary sinuses.  Nasal congestion.  Yellow PND.  Posterior pharynx mildly injected.    Mouth/Throat:     Mouth: Mucous membranes are moist.     Pharynx: Posterior oropharyngeal erythema present.  Eyes:     Conjunctiva/sclera: Conjunctivae normal.     Pupils: Pupils are equal, round, and reactive to light.  Neck:     Musculoskeletal: Normal range of motion.  Cardiovascular:     Rate and Rhythm: Normal rate and regular rhythm.     Heart sounds: Normal heart sounds.  Pulmonary:     Effort: Pulmonary effort is normal. No respiratory distress.     Breath sounds: Normal breath sounds.  Abdominal:     General: There is no distension.     Palpations: Abdomen is soft.  Musculoskeletal: Normal range of motion.  Lymphadenopathy:     Cervical: Cervical adenopathy present.  Skin:    General: Skin is warm and dry.  Neurological:     General: No focal deficit present.     Mental Status: She is alert. Mental status is at baseline.  Psychiatric:        Mood and Affect: Mood normal.        Thought Content: Thought content normal.      UC Treatments / Results  Labs (all labs ordered are listed, but only abnormal results are displayed) Labs Reviewed - No data to display  EKG None  Radiology No results found.  Procedures Procedures (including critical care  time)  Medications Ordered in UC Medications - No data to display  Initial Impression / Assessment and Plan / UC Course  I have reviewed the triage vital signs and the nursing notes.  Pertinent labs & imaging results that were available during my care of the patient were reviewed by me and considered  in my medical decision making (see chart for details).     Discussed the importance of staying on blood pressure medication.  Patient will follow-up with her new PCP shortly. Discussed that this is likely a viral upper respiratory infection. Concerned that she is getting worse instead of better after 5 days.  Patient is given an antibiotic and told to fill and use this if not better over the next couple of days.  Symptomatic care reviewed. Final Clinical Impressions(s) / UC Diagnoses   Final diagnoses:  Cough  Essential hypertension  LRTI (lower respiratory tract infection)     Discharge Instructions     I have refilled your blood pressure medicine Be sure to take this every day I have included information on a heart healthy diet  For the cough I want you to take both Tessalon and Mucinex DM 2 times a day Drink plenty of fluids Take amoxicillin 2 times a day for 7 days Get rest this weekend.  Please consider taking Monday off work Return if you are worse at any time instead of better   ED Prescriptions    Medication Sig Dispense Auth. Provider   hydrochlorothiazide (HYDRODIURIL) 12.5 MG tablet Take 1 tablet (12.5 mg total) by mouth daily. 30 tablet Eustace Moore, MD   benzonatate (TESSALON) 200 MG capsule Take 1 capsule (200 mg total) by mouth 2 (two) times daily as needed for cough. 20 capsule Eustace Moore, MD   dextromethorphan-guaiFENesin [facility] DM) 30-600 MG 12hr tablet Take 1 tablet by mouth 2 (two) times daily. 20 tablet Eustace Moore, MD   amoxicillin (AMOXIL) 875 MG tablet Take 1 tablet (875 mg total) by mouth 2 (two) times daily. 14 tablet Eustace Moore, MD     Controlled Substance Prescriptions [place] Controlled Substance Registry consulted? Not Applicable   Eustace Moore, MD 05/01/18 2111

## 2018-05-01 NOTE — ED Triage Notes (Signed)
Pt c/o sinus issues, congestion, cough since Monday, states she also had HBP states shes been out of her meds for several months, states she has an appt with new PCP next week.

## 2018-05-06 ENCOUNTER — Encounter: Payer: Self-pay | Admitting: Family Medicine

## 2018-05-06 ENCOUNTER — Ambulatory Visit: Payer: Federal, State, Local not specified - PPO | Admitting: Family Medicine

## 2018-05-06 VITALS — BP 160/100 | HR 85 | Temp 97.9°F | Ht 63.0 in | Wt 207.8 lb

## 2018-05-06 DIAGNOSIS — Z7689 Persons encountering health services in other specified circumstances: Secondary | ICD-10-CM | POA: Diagnosis not present

## 2018-05-06 DIAGNOSIS — E559 Vitamin D deficiency, unspecified: Secondary | ICD-10-CM

## 2018-05-06 DIAGNOSIS — Z0000 Encounter for general adult medical examination without abnormal findings: Secondary | ICD-10-CM | POA: Diagnosis not present

## 2018-05-06 DIAGNOSIS — Z1211 Encounter for screening for malignant neoplasm of colon: Secondary | ICD-10-CM | POA: Diagnosis not present

## 2018-05-06 DIAGNOSIS — I10 Essential (primary) hypertension: Secondary | ICD-10-CM

## 2018-05-06 DIAGNOSIS — Z6836 Body mass index (BMI) 36.0-36.9, adult: Secondary | ICD-10-CM

## 2018-05-06 DIAGNOSIS — R7309 Other abnormal glucose: Secondary | ICD-10-CM

## 2018-05-06 LAB — ALT: ALT: 8 U/L (ref 0–35)

## 2018-05-06 LAB — BASIC METABOLIC PANEL
BUN: 14 mg/dL (ref 6–23)
CALCIUM: 9.4 mg/dL (ref 8.4–10.5)
CO2: 26 mEq/L (ref 19–32)
Chloride: 98 mEq/L (ref 96–112)
Creatinine, Ser: 1.29 mg/dL — ABNORMAL HIGH (ref 0.40–1.20)
GFR: 43.34 mL/min — AB (ref >60.00)
GLUCOSE: 134 mg/dL — AB (ref 70–99)
POTASSIUM: 4.3 meq/L (ref 3.5–5.1)
SODIUM: 135 meq/L (ref 135–145)

## 2018-05-06 LAB — VITAMIN D 25 HYDROXY (VIT D DEFICIENCY, FRACTURES): VITD: 14.47 ng/mL — ABNORMAL LOW (ref 30.00–100.00)

## 2018-05-06 LAB — LIPID PANEL
CHOLESTEROL: 197 mg/dL (ref 0–200)
HDL: 29.9 mg/dL — AB (ref >39.00)
NonHDL: 167.1
TRIGLYCERIDES: 237 mg/dL — AB (ref 0.0–149.0)
Total CHOL/HDL Ratio: 7
VLDL: 47.4 mg/dL — AB (ref 0.0–40.0)

## 2018-05-06 LAB — LDL CHOLESTEROL, DIRECT: Direct LDL: 116 mg/dL

## 2018-05-06 LAB — AST: AST: 13 U/L (ref 0–37)

## 2018-05-06 MED ORDER — HYDROCHLOROTHIAZIDE 25 MG PO TABS: 25.0000 mg | ORAL_TABLET | Freq: Every day | ORAL | 3 refills | Status: DC

## 2018-05-06 NOTE — Progress Notes (Signed)
Bailey Ward is a 53 y.o. female  Chief Complaint  Patient presents with  . Establish Care    CPE/ not fasting-- never had colonoscopy/ FYI went to urgent care they gave BP med and was sick    HPI: Bailey Ward is a 53 y.o. female here as a new pt to our office to establish care and for her annual CPE. She is not fasting for labs - had a glass of OJ - but would like to do labs anyway. She has HTN but was off BP med x a few months after it ran out. UC physician refilled for pt. She is on HCTZ 12.5mg  daily. She has been taking sudafed, last dose was yesterday, and didn't think about its effect on BP. She has been on lasix, amlodipine in the past. Amlodipine caused LE edema.   [facility].  Last PAP: 09/2017 Last mammo: 09/2017 Last Dexa: n/a Last colonoscopy: never   Past Medical History:  Diagnosis Date  . Allergy   . Hypertension     Past Surgical History:  Procedure Laterality Date  . ABDOMINAL HYSTERECTOMY      Social History   Socioeconomic History  . Marital status: Married    Spouse name: Not on file  . Number of children: 2  . Years of education: 3818  . Highest education level: Not on file  Occupational History  . Occupation: Patent examinerAnnuity Specialist  Social Needs  . Financial resource strain: Not on file  . Food insecurity:    Worry: Not on file    Inability: Not on file  . Transportation needs:    Medical: Not on file    Non-medical: Not on file  Tobacco Use  . Smoking status: Never Smoker  . Smokeless tobacco: Never Used  Substance and Sexual Activity  . Alcohol use: No  . Drug use: No  . Sexual activity: Not on file  Lifestyle  . Physical activity:    Days per week: Not on file    Minutes per session: Not on file  . Stress: Not on file  Relationships  . Social connections:    Talks on phone: Not on file    Gets together: Not on file    Attends religious service: Not on file    Active member of club or  organization: Not on file    Attends meetings of clubs or organizations: Not on file    Relationship status: Not on file  . Intimate partner violence:    Fear of current or ex partner: Not on file    Emotionally abused: Not on file    Physically abused: Not on file    Forced sexual activity: Not on file  Other Topics Concern  . Not on file  Social History Narrative   [place] and raised in [place]. Fun[place], watch her daughter play soccer, watch movies   Denies religious beliefs that would effect health care.    Denies abuse and feels safe at home.     Family History  Problem Relation Age of Onset  . Hypertension Mother   . Diabetes Father   . Hypertension Father       There is no immunization history on file for this patient.  Outpatient Encounter Medications as of 05/06/2018  Medication Sig  . amoxicillin (AMOXIL) 875 MG tablet Take 1 tablet (875 mg total) by mouth 2 (two) times daily.  . benzonatate (  TESSALON) 200 MG capsule Take 1 capsule (200 mg total) by mouth 2 (two) times daily as needed for cough.  . cetirizine (ZYRTEC) 10 MG tablet Take 10 mg by mouth daily as needed for allergies (only in allergy season).  Marland Kitchen dextromethorphan-guaiFENesin (MUCINEX DM) 30-600 MG 12hr tablet Take 1 tablet by mouth 2 (two) times daily.  . [DISCONTINUED] hydrochlorothiazide (HYDRODIURIL) 12.5 MG tablet Take 1 tablet (12.5 mg total) by mouth daily.  . Cholecalciferol (VITAMIN D3) 50000 units CAPS TAKE ONE CAPSULE BY MOUTH WEEKLY (Patient not taking: Reported on 05/06/2018)  . hydrochlorothiazide (HYDRODIURIL) 25 MG tablet Take 1 tablet (25 mg total) by mouth daily.   No facility-administered encounter medications on file as of 05/06/2018.      ROS: Gen: no fever, chills  Skin: no rash, itching ENT: no ear pain, ear drainage, nasal congestion, rhinorrhea, sinus pressure, sore throat Eyes: no blurry vision, double vision Resp: no cough, wheeze,SOB Breast: no  breast tenderness, no nipple discharge, no breast masses CV: no CP, palpitations, LE edema,  GI: no heartburn, n/v/d/c, abd pain GU: no dysuria, urgency, frequency, hematuria; no vaginal itching, odor, discharge MSK: no joint pain, myalgias, back pain Neuro: no dizziness, headache, weakness, vertigo Psych: no depression, anxiety, insomnia   No Known Allergies  BP (!) 160/100   Pulse 85   Temp 97.9 F (36.6 C) (Oral)   Ht 5\' 3"  (1.6 m)   Wt 207 lb 12.8 oz (94.3 kg)   SpO2 98%   BMI 36.81 kg/m   BP Readings from Last 3 Encounters:  05/06/18 (!) 160/100  05/01/18 (!) 150/92  08/02/16 128/88     Physical Exam  Constitutional: She is oriented to person, place, and time. She appears well-developed and well-nourished. No distress.  HENT:  Head: Normocephalic and atraumatic.  Right Ear: Tympanic membrane and ear canal normal.  Left Ear: Tympanic membrane and ear canal normal.  Nose: Rhinorrhea present. No mucosal edema. Right sinus exhibits no maxillary sinus tenderness and no frontal sinus tenderness. Left sinus exhibits no maxillary sinus tenderness and no frontal sinus tenderness.  Mouth/Throat: Oropharynx is clear and moist and mucous membranes are normal.  Eyes: Pupils are equal, round, and reactive to light. Conjunctivae are normal.  Neck: Neck supple. No JVD present. No thyromegaly present.  Cardiovascular: Normal rate, regular rhythm, normal heart sounds and intact distal pulses.  Pulmonary/Chest: Effort normal and breath sounds normal. No respiratory distress. She has no wheezes. She has no rhonchi.  Abdominal: Soft. Bowel sounds are normal. She exhibits no distension and no mass. There is no abdominal tenderness.  Musculoskeletal: Normal range of motion.        General: No edema.  Lymphadenopathy:    She has no cervical adenopathy.  Neurological: She is alert and oriented to person, place, and time. She has normal reflexes. Coordination normal.  Skin: Skin is warm and  dry.  Psychiatric: She has a normal mood and affect. Her behavior is normal.     A/P:  1. Annual physical exam - UTD on PAP, mammo - due for dental and vision exams - encouraged pt to schedule these - anticipatory guidance discussed - discussed importance of healthy diet (limiting sweet tea) and regular CV exercise on overall health, CV health, weight - ALT - AST - Basic metabolic panel - Lipid panel - next CPE in 1 year  2. Encounter to establish care with new doctor  3. Screening for colon cancer - Ambulatory referral to Gastroenterology  4. Essential  hypertension - not at goal - reviewed DASH diet, encouraged pt start to walk/exercise regularly with goal of 30-6345min 3-5x/wk Increase: - hydrochlorothiazide (HYDRODIURIL) 25 MG tablet; Take 1 tablet (25 mg total) by mouth daily.  Dispense: 90 tablet; Refill: 3 - increased from 12.5mg  to 25mg  - pt to check BP at home and keep log and send MyChart message with readings in 2-3 wks  5. Vitamin D deficiency - VITAMIN D 25 Hydroxy (Vit-D Deficiency, Fractures)  Discussed plan and reviewed medications with patient, including risks, benefits, and potential side effects. Pt expressed understand. All questions answered.

## 2018-05-06 NOTE — Patient Instructions (Addendum)
Health Maintenance, Female Adopting a healthy lifestyle and getting preventive care can go a long way to promote health and wellness. Talk with your health care provider about what schedule of regular examinations is right for you. This is a good chance for you to check in with your provider about disease prevention and staying healthy. In between checkups, there are plenty of things you can do on your own. Experts have done a lot of research about which lifestyle changes and preventive measures are most likely to keep you healthy. Ask your health care provider for more information. Weight and diet Eat a healthy diet  Be sure to include plenty of vegetables, fruits, low-fat dairy products, and lean protein.  Do not eat a lot of foods high in solid fats, added sugars, or salt.  Get regular exercise. This is one of the most important things you can do for your health. ? Most adults should exercise for at least 150 minutes each week. The exercise should increase your heart rate and make you sweat (moderate-intensity exercise). ? Most adults should also do strengthening exercises at least twice a week. This is in addition to the moderate-intensity exercise. Maintain a healthy weight  Body mass index (BMI) is a measurement that can be used to identify possible weight problems. It estimates body fat based on height and weight. Your health care provider can help determine your BMI and help you achieve or maintain a healthy weight.  For females 1 years of age and older: ? A BMI below 18.5 is considered underweight. ? A BMI of 18.5 to 24.9 is normal. ? A BMI of 25 to 29.9 is considered overweight. ? A BMI of 30 and above is considered obese. Watch levels of cholesterol and blood lipids  You should start having your blood tested for lipids and cholesterol at 53 years of age, then have this test every 5 years.  You may need to have your cholesterol levels checked more often if: ? Your lipid or  cholesterol levels are high. ? You are older than 53 years of age. ? You are at high risk for heart disease. Cancer screening Lung Cancer  Lung cancer screening is recommended for adults 32-78 years old who are at high risk for lung cancer because of a history of smoking.  A yearly low-dose CT scan of the lungs is recommended for people who: ? Currently smoke. ? Have quit within the past 15 years. ? Have at least a 30-pack-year history of smoking. A pack year is smoking an average of one pack of cigarettes a day for 1 year.  Yearly screening should continue until it has been 15 years since you quit.  Yearly screening should stop if you develop a health problem that would prevent you from having lung cancer treatment. Breast Cancer  Practice breast self-awareness. This means understanding how your breasts normally appear and feel.  It also means doing regular breast self-exams. Let your health care provider know about any changes, no matter how small.  If you are in your 20s or 30s, you should have a clinical breast exam (CBE) by a health care provider every 1-3 years as part of a regular health exam.  If you are 3 or older, have a CBE every year. Also consider having a breast X-ray (mammogram) every year.  If you have a family history of breast cancer, talk to your health care provider about genetic screening.  If you are at high risk for breast  to your health care provider about having an MRI and a mammogram every year.  Breast cancer gene (BRCA) assessment is recommended for women who have family members with BRCA-related cancers. BRCA-related cancers include: ? Breast. ? Ovarian. ? Tubal. ? Peritoneal cancers.  Results of the assessment will determine the need for genetic counseling and BRCA1 and BRCA2 testing. Cervical Cancer Your health care provider may recommend that you be screened regularly for cancer of the pelvic organs (ovaries, uterus, and vagina).  This screening involves a pelvic examination, including checking for microscopic changes to the surface of your cervix (Pap test). You may be encouraged to have this screening done every 3 years, beginning at age 21.  For women ages 30-65, health care providers may recommend pelvic exams and Pap testing every 3 years, or they may recommend the Pap and pelvic exam, combined with testing for human papilloma virus (HPV), every 5 years. Some types of HPV increase your risk of cervical cancer. Testing for HPV may also be done on women of any age with unclear Pap test results.  Other health care providers may not recommend any screening for nonpregnant women who are considered low risk for pelvic cancer and who do not have symptoms. Ask your health care provider if a screening pelvic exam is right for you.  If you have had past treatment for cervical cancer or a condition that could lead to cancer, you need Pap tests and screening for cancer for at least 20 years after your treatment. If Pap tests have been discontinued, your risk factors (such as having a new sexual partner) need to be reassessed to determine if screening should resume. Some women have medical problems that increase the chance of getting cervical cancer. In these cases, your health care provider may recommend more frequent screening and Pap tests. Colorectal Cancer  This type of cancer can be detected and often prevented.  Routine colorectal cancer screening usually begins at 53 years of age and continues through 53 years of age.  Your health care provider may recommend screening at an earlier age if you have risk factors for colon cancer.  Your health care provider may also recommend using home test kits to check for hidden blood in the stool.  A small camera at the end of a tube can be used to examine your colon directly (sigmoidoscopy or colonoscopy). This is done to check for the earliest forms of colorectal cancer.  Routine  screening usually begins at age 50.  Direct examination of the colon should be repeated every 5-10 years through 53 years of age. However, you may need to be screened more often if early forms of precancerous polyps or small growths are found. Skin Cancer  Check your skin from head to toe regularly.  Tell your health care provider about any new moles or changes in moles, especially if there is a change in a mole's shape or color.  Also tell your health care provider if you have a mole that is larger than the size of a pencil eraser.  Always use sunscreen. Apply sunscreen liberally and repeatedly throughout the day.  Protect yourself by wearing long sleeves, pants, a wide-brimmed hat, and sunglasses whenever you are outside. Heart disease, diabetes, and high blood pressure  High blood pressure causes heart disease and increases the risk of stroke. High blood pressure is more likely to develop in: ? People who have blood pressure in the high end of the normal range (130-139/85-89 mm Hg). ? People   who are overweight or obese. ? People who are African American.  If you are 18-39 years of age, have your blood pressure checked every 3-5 years. If you are 40 years of age or older, have your blood pressure checked every year. You should have your blood pressure measured twice-once when you are at a hospital or clinic, and once when you are not at a hospital or clinic. Record the average of the two measurements. To check your blood pressure when you are not at a hospital or clinic, you can use: ? An automated blood pressure machine at a pharmacy. ? A home blood pressure monitor.  If you are between 55 years and 79 years old, ask your health care provider if you should take aspirin to prevent strokes.  Have regular diabetes screenings. This involves taking a blood sample to check your fasting blood sugar level. ? If you are at a normal weight and have a low risk for diabetes, have this test once  every three years after 53 years of age. ? If you are overweight and have a high risk for diabetes, consider being tested at a younger age or more often. Preventing infection Hepatitis B  If you have a higher risk for hepatitis B, you should be screened for this virus. You are considered at high risk for hepatitis B if: ? You were born in a country where hepatitis B is common. Ask your health care provider which countries are considered high risk. ? Your parents were born in a high-risk country, and you have not been immunized against hepatitis B (hepatitis B vaccine). ? You have HIV or AIDS. ? You use needles to inject street drugs. ? You live with someone who has hepatitis B. ? You have had sex with someone who has hepatitis B. ? You get hemodialysis treatment. ? You take certain medicines for conditions, including cancer, organ transplantation, and autoimmune conditions. Hepatitis C  Blood testing is recommended for: ? Everyone born from 1945 through 1965. ? Anyone with known risk factors for hepatitis C. Sexually transmitted infections (STIs)  You should be screened for sexually transmitted infections (STIs) including gonorrhea and chlamydia if: ? You are sexually active and are younger than 53 years of age. ? You are older than 53 years of age and your health care provider tells you that you are at risk for this type of infection. ? Your sexual activity has changed since you were last screened and you are at an increased risk for chlamydia or gonorrhea. Ask your health care provider if you are at risk.  If you do not have HIV, but are at risk, it may be recommended that you take a prescription medicine daily to prevent HIV infection. This is called pre-exposure prophylaxis (PrEP). You are considered at risk if: ? You are sexually active and do not regularly use condoms or know the HIV status of your partner(s). ? You take drugs by injection. ? You are sexually active with a partner  who has HIV. Talk with your health care provider about whether you are at high risk of being infected with HIV. If you choose to begin PrEP, you should first be tested for HIV. You should then be tested every 3 months for as long as you are taking PrEP. Pregnancy  If you are premenopausal and you may become pregnant, ask your health care provider about preconception counseling.  If you may become pregnant, take 400 to 800 micrograms (mcg) of folic acid every   day.  If you want to prevent pregnancy, talk to your health care provider about birth control (contraception). Osteoporosis and menopause  Osteoporosis is a disease in which the bones lose minerals and strength with aging. This can result in serious bone fractures. Your risk for osteoporosis can be identified using a bone density scan.  If you are 65 years of age or older, or if you are at risk for osteoporosis and fractures, ask your health care provider if you should be screened.  Ask your health care provider whether you should take a calcium or vitamin D supplement to lower your risk for osteoporosis.  Menopause may have certain physical symptoms and risks.  Hormone replacement therapy may reduce some of these symptoms and risks. Talk to your health care provider about whether hormone replacement therapy is right for you. Follow these instructions at home:  Schedule regular health, dental, and eye exams.  Stay current with your immunizations.  Do not use any tobacco products including cigarettes, chewing tobacco, or electronic cigarettes.  If you are pregnant, do not drink alcohol.  If you are breastfeeding, limit how much and how often you drink alcohol.  Limit alcohol intake to no more than 1 drink per day for nonpregnant women. One drink equals 12 ounces of beer, 5 ounces of wine, or 1 ounces of hard liquor.  Do not use street drugs.  Do not share needles.  Ask your health care provider for help if you need support  or information about quitting drugs.  Tell your health care provider if you often feel depressed.  Tell your health care provider if you have ever been abused or do not feel safe at home. This information is not intended to replace advice given to you by your health care provider. Make sure you discuss any questions you have with your health care provider. Document Released: 10/08/2010 Document Revised: 08/31/2015 Document Reviewed: 12/27/2014 Elsevier Interactive Patient Education  2019 [employer]  

## 2018-05-06 NOTE — Addendum Note (Signed)
Addended by: Overton Mam on: 05/06/2018 01:08 PM   Modules accepted: Orders

## 2018-05-13 ENCOUNTER — Other Ambulatory Visit (INDEPENDENT_AMBULATORY_CARE_PROVIDER_SITE_OTHER): Payer: Federal, State, Local not specified - PPO

## 2018-05-13 ENCOUNTER — Other Ambulatory Visit: Payer: Federal, State, Local not specified - PPO

## 2018-05-13 DIAGNOSIS — I10 Essential (primary) hypertension: Secondary | ICD-10-CM

## 2018-05-13 LAB — BASIC METABOLIC PANEL
BUN: 18 mg/dL (ref 6–23)
CO2: 28 mEq/L (ref 19–32)
Calcium: 9.4 mg/dL (ref 8.4–10.5)
Chloride: 101 mEq/L (ref 96–112)
Creatinine, Ser: 1.35 mg/dL — ABNORMAL HIGH (ref 0.40–1.20)
GFR: 41.13 mL/min — ABNORMAL LOW (ref >60.00)
GLUCOSE: 97 mg/dL (ref 70–99)
Potassium: 3.6 mEq/L (ref 3.5–5.1)
SODIUM: 140 meq/L (ref 135–145)

## 2018-05-13 LAB — LIPID PANEL
Cholesterol: 189 mg/dL (ref 0–200)
HDL: 32.9 mg/dL — AB (ref >39.00)
LDL Cholesterol: 117 mg/dL — ABNORMAL HIGH (ref 0–99)
NONHDL: 156.12
Total CHOL/HDL Ratio: 6
Triglycerides: 197 mg/dL — ABNORMAL HIGH (ref 0.0–149.0)
VLDL: 39.4 mg/dL (ref 0.0–40.0)

## 2018-06-05 ENCOUNTER — Encounter: Payer: Self-pay | Admitting: Family Medicine

## 2018-06-08 ENCOUNTER — Ambulatory Visit (INDEPENDENT_AMBULATORY_CARE_PROVIDER_SITE_OTHER): Payer: Federal, State, Local not specified - PPO | Admitting: Orthopedic Surgery

## 2018-06-24 ENCOUNTER — Encounter (INDEPENDENT_AMBULATORY_CARE_PROVIDER_SITE_OTHER): Payer: Self-pay | Admitting: Family

## 2018-06-24 ENCOUNTER — Ambulatory Visit (INDEPENDENT_AMBULATORY_CARE_PROVIDER_SITE_OTHER): Payer: Federal, State, Local not specified - PPO | Admitting: Family

## 2018-06-24 ENCOUNTER — Other Ambulatory Visit: Payer: Self-pay

## 2018-06-24 VITALS — Ht 63.0 in | Wt 207.8 lb

## 2018-06-24 DIAGNOSIS — G8929 Other chronic pain: Secondary | ICD-10-CM | POA: Diagnosis not present

## 2018-06-24 DIAGNOSIS — M545 Low back pain, unspecified: Secondary | ICD-10-CM

## 2018-06-25 ENCOUNTER — Ambulatory Visit: Payer: Federal, State, Local not specified - PPO | Admitting: Family Medicine

## 2018-06-25 ENCOUNTER — Encounter: Payer: Self-pay | Admitting: Family Medicine

## 2018-06-25 VITALS — BP 162/102 | HR 78 | Temp 98.1°F | Ht 63.0 in | Wt 208.0 lb

## 2018-06-25 DIAGNOSIS — E559 Vitamin D deficiency, unspecified: Secondary | ICD-10-CM

## 2018-06-25 DIAGNOSIS — R600 Localized edema: Secondary | ICD-10-CM

## 2018-06-25 DIAGNOSIS — I10 Essential (primary) hypertension: Secondary | ICD-10-CM

## 2018-06-25 MED ORDER — VITAMIN D3 1.25 MG (50000 UT) PO CAPS: 1.0000 | ORAL_CAPSULE | ORAL | 2 refills | Status: DC

## 2018-06-25 MED ORDER — LOSARTAN POTASSIUM-HCTZ 100-25 MG PO TABS
1.0000 | ORAL_TABLET | Freq: Every day | ORAL | 3 refills | Status: DC
Start: 2018-06-25 — End: 2019-09-07

## 2018-06-25 NOTE — Patient Instructions (Addendum)
Check BP 2-3x/wk x 3 weeks and keep log of these readings Stop HCTZ Start losartan/HCTZ 100/12.5mg  1 tab daily Follow up in 3-4 weeks Under 140 / under 90 = goal  Take Vit D 50,000 units 1 cap per week x 12 weeks Then after 12 weeks start Vit D 2000 units daily    DASH Eating Plan DASH stands for "Dietary Approaches to Stop Hypertension." The DASH eating plan is a healthy eating plan that has been shown to reduce high blood pressure (hypertension). It may also reduce your risk for type 2 diabetes, heart disease, and stroke. The DASH eating plan may also help with weight loss. What are tips for following this plan?  General guidelines  Avoid eating more than 2,300 mg (milligrams) of salt (sodium) a day. If you have hypertension, you may need to reduce your sodium intake to 1,500 mg a day.  Limit alcohol intake to no more than 1 drink a day for nonpregnant women and 2 drinks a day for men. One drink equals 12 oz of beer, 5 oz of wine, or 1 oz of hard liquor.  Work with your health care provider to maintain a healthy body weight or to lose weight. Ask what an ideal weight is for you.  Get at least 30 minutes of exercise that causes your heart to beat faster (aerobic exercise) most days of the week. Activities may include walking, swimming, or biking.  Work with your health care provider or diet and nutrition specialist (dietitian) to adjust your eating plan to your individual calorie needs. Reading food labels   Check food labels for the amount of sodium per serving. Choose foods with less than 5 percent of the Daily Value of sodium. Generally, foods with less than 300 mg of sodium per serving fit into this eating plan.  To find whole grains, look for the word "whole" as the first word in the ingredient list. Shopping  Buy products labeled as "low-sodium" or "no salt added."  Buy fresh foods. Avoid canned foods and premade or frozen meals. Cooking  Avoid adding salt when cooking.  Use salt-free seasonings or herbs instead of table salt or sea salt. Check with your health care provider or pharmacist before using salt substitutes.  Do not fry foods. Cook foods using healthy methods such as baking, boiling, grilling, and broiling instead.  Cook with heart-healthy oils, such as olive, canola, soybean, or sunflower oil. Meal planning  Eat a balanced diet that includes: ? 5 or more servings of fruits and vegetables each day. At each meal, try to fill half of your plate with fruits and vegetables. ? Up to 6-8 servings of whole grains each day. ? Less than 6 oz of lean meat, poultry, or fish each day. A 3-oz serving of meat is about the same size as a deck of cards. One egg equals 1 oz. ? 2 servings of low-fat dairy each day. ? A serving of nuts, seeds, or beans 5 times each week. ? Heart-healthy fats. Healthy fats called Omega-3 fatty acids are found in foods such as flaxseeds and coldwater fish, like sardines, salmon, and mackerel.  Limit how much you eat of the following: ? Canned or prepackaged foods. ? Food that is high in trans fat, such as fried foods. ? Food that is high in saturated fat, such as fatty meat. ? Sweets, desserts, sugary drinks, and other foods with added sugar. ? Full-fat dairy products.  Do not salt foods before eating.  Try to  eat at least 2 vegetarian meals each week.  Eat more home-cooked food and less restaurant, buffet, and fast food.  When eating at a restaurant, ask that your food be prepared with less salt or no salt, if possible. What foods are recommended? The items listed may not be a complete list. Talk with your dietitian about what dietary choices are best for you. Grains Whole-grain or whole-wheat bread. Whole-grain or whole-wheat pasta. Brown rice. Orpah Cobb. Bulgur. Whole-grain and low-sodium cereals. Pita bread. Low-fat, low-sodium crackers. Whole-wheat flour tortillas. Vegetables Fresh or frozen vegetables (raw,  steamed, roasted, or grilled). Low-sodium or reduced-sodium tomato and vegetable juice. Low-sodium or reduced-sodium tomato sauce and tomato paste. Low-sodium or reduced-sodium canned vegetables. Fruits All fresh, dried, or frozen fruit. Canned fruit in natural juice (without added sugar). Meat and other protein foods Skinless chicken or [place]. Ground chicken or [place]. Pork with fat trimmed off. Fish and seafood. Egg whites. Dried beans, peas, or lentils. Unsalted nuts, nut butters, and seeds. Unsalted canned beans. Lean cuts of beef with fat trimmed off. Low-sodium, lean deli meat. Dairy Low-fat (1%) or fat-free (skim) milk. Fat-free, low-fat, or reduced-fat cheeses. Nonfat, low-sodium ricotta or cottage cheese. Low-fat or nonfat yogurt. Low-fat, low-sodium cheese. Fats and oils Soft margarine without trans fats. Vegetable oil. Low-fat, reduced-fat, or light mayonnaise and salad dressings (reduced-sodium). Canola, safflower, olive, soybean, and sunflower oils. Avocado. Seasoning and other foods Herbs. Spices. Seasoning mixes without salt. Unsalted popcorn and pretzels. Fat-free sweets. What foods are not recommended? The items listed may not be a complete list. Talk with your dietitian about what dietary choices are best for you. Grains Baked goods made with fat, such as croissants, muffins, or some breads. Dry pasta or rice meal packs. Vegetables Creamed or fried vegetables. Vegetables in a cheese sauce. Regular canned vegetables (not low-sodium or reduced-sodium). Regular canned tomato sauce and paste (not low-sodium or reduced-sodium). Regular tomato and vegetable juice (not low-sodium or reduced-sodium). Rosita Fire. Olives. Fruits Canned fruit in a light or heavy syrup. Fried fruit. Fruit in cream or butter sauce. Meat and other protein foods Fatty cuts of meat. Ribs. Fried meat. Tomasa Blase. Sausage. Bologna and other processed lunch meats. Salami. Fatback. Hotdogs. Bratwurst. Salted nuts and  seeds. Canned beans with added salt. Canned or smoked fish. Whole eggs or egg yolks. Chicken or [place] with skin. Dairy Whole or 2% milk, cream, and half-and-half. Whole or full-fat cream cheese. Whole-fat or sweetened yogurt. Full-fat cheese. Nondairy creamers. Whipped toppings. Processed cheese and cheese spreads. Fats and oils Butter. Stick margarine. Lard. Shortening. Ghee. Bacon fat. Tropical oils, such as coconut, palm kernel, or palm oil. Seasoning and other foods Salted popcorn and pretzels. Onion salt, garlic salt, seasoned salt, table salt, and sea salt. Worcestershire sauce. Tartar sauce. Barbecue sauce. Teriyaki sauce. Soy sauce, including reduced-sodium. Steak sauce. Canned and packaged gravies. Fish sauce. Oyster sauce. Cocktail sauce. Horseradish that you find on the shelf. Ketchup. Mustard. Meat flavorings and tenderizers. Bouillon cubes. Hot sauce and Tabasco sauce. Premade or packaged marinades. Premade or packaged taco seasonings. Relishes. Regular salad dressings. Where to find more information:  National Heart, Lung, and Blood Institute: PopSteam.is  American Heart Association: www.heart.org Summary  The DASH eating plan is a healthy eating plan that has been shown to reduce high blood pressure (hypertension). It may also reduce your risk for type 2 diabetes, heart disease, and stroke.  With the DASH eating plan, you should limit salt (sodium) intake to 2,300 mg a day. If you have  hypertension, you may need to reduce your sodium intake to 1,500 mg a day.  When on the DASH eating plan, aim to eat more fresh fruits and vegetables, whole grains, lean proteins, low-fat dairy, and heart-healthy fats.  Work with your health care provider or diet and nutrition specialist (dietitian) to adjust your eating plan to your individual calorie needs. This information is not intended to replace advice given to you by your health care provider. Make sure you discuss any questions you  have with your health care provider. Document Released: 03/14/2011 Document Revised: 03/18/2016 Document Reviewed: 03/18/2016 Elsevier Interactive Patient Education  2019 [employer].

## 2018-06-25 NOTE — Progress Notes (Signed)
Chief Complaint  Patient presents with  . Follow-up    f/u BP haven't taken BP/ started taken two of the BP meds/ F/u lab    HPI: Bailey Ward is a 53 y.o. female here for HTN follow-up. Pt was last seen by me on 05/06/18. Pt states she has not been checking her BP at home. She has been taking HCTZ 50mg  (2 25mg  tabs) daily for the past 8-10 days and 25mg  daily prior to that.  She has been on lisinopril, amlodipine in the past. Amlodipine caused LE edema and lisinopril caused cough.  Diet: pt loves soup, deli sandwiches Exercise: none  BP Readings from Last 3 Encounters:  06/25/18 (!) 162/102  05/06/18 (!) 160/100  05/01/18 (!) 150/92   Lab Results  Component Value Date   CREATININE 1.35 (H) 05/13/2018   BUN 18 05/13/2018   NA 140 05/13/2018   K 3.6 05/13/2018   CL 101 05/13/2018   CO2 28 05/13/2018    Past Medical History:  Diagnosis Date  . Allergy   . Hypertension     Past Surgical History:  Procedure Laterality Date  . ABDOMINAL HYSTERECTOMY      Social History   Socioeconomic History  . Marital status: Married    Spouse name: Not on file  . Number of children: 2  . Years of education: 64  . Highest education level: Not on file  Occupational History  . Occupation: Patent examiner  Social Needs  . Financial resource strain: Not on file  . Food insecurity:    Worry: Not on file    Inability: Not on file  . Transportation needs:    Medical: Not on file    Non-medical: Not on file  Tobacco Use  . Smoking status: Never Smoker  . Smokeless tobacco: Never Used  Substance and Sexual Activity  . Alcohol use: No  . Drug use: No  . Sexual activity: Not on file  Lifestyle  . Physical activity:    Days per week: Not on file    Minutes per session: Not on file  . Stress: Not on file  Relationships  . Social connections:    Talks on phone: Not on file    Gets together: Not on file    Attends religious service: Not on file    Active member  of club or organization: Not on file    Attends meetings of clubs or organizations: Not on file    Relationship status: Not on file  . Intimate partner violence:    Fear of current or ex partner: Not on file    Emotionally abused: Not on file    Physically abused: Not on file    Forced sexual activity: Not on file  Other Topics Concern  . Not on file  Social History Narrative   Born in [place] and raised in [place]. Fun: Travel, go to [employer], watch her daughter play soccer, watch movies   Denies religious beliefs that would effect health care.    Denies abuse and feels safe at home.     Family History  Problem Relation Age of Onset  . Hypertension Mother   . Diabetes Father   . Hypertension Father       There is no immunization history on file for this patient.  Outpatient Encounter Medications as of 06/25/2018  Medication Sig  . cetirizine (ZYRTEC) 10 MG tablet Take 10 mg by mouth daily as needed for allergies (only in  allergy season).  . hydrochlorothiazide (HYDRODIURIL) 25 MG tablet Take 1 tablet (25 mg total) by mouth daily.  . [DISCONTINUED] dextromethorphan-guaiFENesin (MUCINEX DM) 30-600 MG 12hr tablet Take 1 tablet by mouth 2 (two) times daily.  . Cholecalciferol (VITAMIN D3) 1.25 MG (50000 UT) CAPS Take 1 capsule by mouth once a week.  . losartan-hydrochlorothiazide (HYZAAR) 100-25 MG tablet Take 1 tablet by mouth daily.  . [DISCONTINUED] amoxicillin (AMOXIL) 875 MG tablet Take 1 tablet (875 mg total) by mouth 2 (two) times daily.  . [DISCONTINUED] benzonatate (TESSALON) 200 MG capsule Take 1 capsule (200 mg total) by mouth 2 (two) times daily as needed for cough.  . [DISCONTINUED] Cholecalciferol (VITAMIN D3) 50000 units CAPS TAKE ONE CAPSULE BY MOUTH WEEKLY (Patient not taking: Reported on 06/25/2018)   No facility-administered encounter medications on file as of 06/25/2018.      ROS: Gen: no fever, chills  Skin: no rash, itching Eyes: no blurry vision, double  vision Resp: no cough, wheeze,SOB CV: no CP, palpitations, LE edema,  GI: no heartburn, n/v/d/c, abd pain Neuro: no dizziness, headache, weakness    No Known Allergies  BP (!) 162/102   Pulse 78   Temp 98.1 F (36.7 C) (Oral)   Ht 5\' 3"  (1.6 m)   Wt 208 lb (94.3 kg)   SpO2 98%   BMI 36.85 kg/m   BP Readings from Last 3 Encounters:  06/25/18 (!) 162/102  05/06/18 (!) 160/100  05/01/18 (!) 150/92   Wt Readings from Last 3 Encounters:  06/25/18 208 lb (94.3 kg)  06/24/18 207 lb 12.8 oz (94.3 kg)  05/06/18 207 lb 12.8 oz (94.3 kg)    Physical Exam  Constitutional: She is oriented to person, place, and time. She appears well-developed and well-nourished. No distress.  Neck: Neck supple. No JVD present. No thyromegaly present.  Cardiovascular: Normal rate, regular rhythm and intact distal pulses.  Pulmonary/Chest: Effort normal and breath sounds normal.  Musculoskeletal:        General: Edema (B/L +1 pedal edema) present.  Lymphadenopathy:    She has no cervical adenopathy.  Neurological: She is alert and oriented to person, place, and time.  Psychiatric: She has a normal mood and affect. Her behavior is normal.     A/P:  1. Essential hypertension - uncontrolled - pt has not checked her BP at home, diet is high in sodium, no exercise - stop HCTZ Rx: - losartan-hydrochlorothiazide (HYZAAR) 100-25 MG tablet; Take 1 tablet by mouth daily.  Dispense: 90 tablet; Refill: 3 - pt will check BP at home 2-3x/wk x 3 wks then f/u - included info in AVS re: DASH diet Discussed plan and reviewed medications with patient, including risks, benefits, and potential side effects. Pt expressed understand. All questions answered.   2. Vitamin D deficiency Rx: - Cholecalciferol (VITAMIN D3) 1.25 MG (50000 UT) CAPS; Take 1 capsule by mouth once a week.  Dispense: 4 capsule; Refill: 2 - after 12 wks, pt should take OTC Vit D 2000IU daily  3. Bilateral lower extremity edema - limit  sodium intake - recommended compression stockings, more exercise/walking - if no improvement with above at f/u OV for HTN, will add PRN lasix

## 2018-06-29 ENCOUNTER — Encounter (INDEPENDENT_AMBULATORY_CARE_PROVIDER_SITE_OTHER): Payer: Self-pay | Admitting: Family

## 2018-06-29 NOTE — Progress Notes (Signed)
Office Visit Note   Patient: Bailey Ward           Date of Birth: 22-Aug-1965           MRN: [id number] Visit Date: 06/24/2018              Requested by: Overton Mam, DO [address] [place], [place] [address] PCP: Overton Mam, DO  Chief Complaint  Patient presents with  . Lower Back - Pain      HPI: The patient is a 53 year old female who is seen for follow-up of her lumbar back pain with left sided radicular/sciatic symptoms.  She reports she is feeling better overall.  She did go to physical therapy, remotely and is now on a home exercise program and reports that this does help.  She reports she currently is only having back pain every three days or so.  She has begun working 4 hour shifts. Which has greatly improved her ability to cope. She previously sat for long periods at work and that this exacerbates her pain.  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  She states that when she starts hurting she does not have a more comfortable position to get in at work and only feels better when she can go and lay down at home.  She reports that she does well in the morning but then has increased pain by the afternoon.    When does does have pain she continues to have pain over the low back back radiating down the left lateral thigh into the left calf but no overt weakness.  She felt like Relafen helped better and she would like some of this again.  Assessment & Plan: Visit Diagnoses:  1. Chronic left-sided low back pain, unspecified whether sciatica present     Plan: The patient will continue her home exercise program for core strengthening.  Also refilled a prescription for Relafen 750 mg daily for her back discomfort.    Follow-Up Instructions: Return if symptoms worsen or fail to improve.   Back Exam   Muscle Strength  The  patient has normal back strength.      Patient is alert, oriented, no adenopathy, well-dressed, normal affect, normal respiratory effort. The patient is mildly tender to palpation over her lumbar spine.  She has a weakly positive straight leg raise on the left negative on the right.  She has a mildly antalgic appearing gait.  She has good EHL on the left and right and they are symmetric.  Imaging: No results found. No images are attached to the encounter.  Labs: No results found for: HGBA1C, ESRSEDRATE, CRP, LABURIC, REPTSTATUS, GRAMSTAIN, CULT, LABORGA   Lab Results  Component Value Date   ALBUMIN 4.1 08/20/2016   ALBUMIN 4.2 08/22/2015    Body mass index is 36.81 kg/m.  Orders:  No orders of the defined types were placed in this encounter.  No orders of the defined types were placed in this encounter.    Procedures: No procedures performed  Clinical Data: No additional findings.  ROS:  All other systems negative, except as noted in the HPI. Review of Systems  Constitutional: Negative for chills and fever.  Musculoskeletal: Positive for back pain. Negative for myalgias.  Neurological: Negative for weakness and numbness.    Objective: Vital Signs: Ht 5\' 3"  (1.6 m)  Wt 207 lb 12.8 oz (94.3 kg)   BMI 36.81 kg/m   Specialty Comments:  No specialty comments available.  PMFS History: Patient Active Problem List   Diagnosis Date Noted  . Idiopathic chronic venous hypertension of both lower extremities with inflammation 07/28/2017  . Chronic pain of left knee 12/04/2016  . Vitamin D deficiency 08/02/2016  . Encounter for general adult medical examination with abnormal findings 08/01/2015  . Obesity 08/01/2015  . Hair loss 09/08/2014  . Essential hypertension 06/24/2014   Past Medical History:  Diagnosis Date  . Allergy   . Hypertension     Family History  Problem Relation Age of Onset  . Hypertension Mother   . Diabetes Father   . Hypertension  Father     Past Surgical History:  Procedure Laterality Date  . ABDOMINAL HYSTERECTOMY     Social History   Occupational History  . Occupation: Patent examiner  Tobacco Use  . Smoking status: Never Smoker  . Smokeless tobacco: Never Used  Substance and Sexual Activity  . Alcohol use: No  . Drug use: No  . Sexual activity: Not on file

## 2018-07-01 MED ORDER — NABUMETONE 750 MG PO TABS: 750.0000 mg | ORAL_TABLET | Freq: Two times a day (BID) | ORAL | 3 refills | Status: AC | PRN

## 2018-07-31 ENCOUNTER — Telehealth: Payer: Self-pay | Admitting: Family Medicine

## 2018-07-31 NOTE — Telephone Encounter (Signed)
Called and talked with patient about scheduling virtual visit with Dr. Salena Saner. Patient states that she will give [place] a call back to schedule appt.

## 2018-11-11 ENCOUNTER — Telehealth: Payer: Self-pay | Admitting: Orthopedic Surgery

## 2018-11-11 NOTE — Telephone Encounter (Signed)
Patient called stating that the paper work that sent over from [employer] stated that she could miss 2 hours per episode.  She stated that that was not what Dr. Sharol Given and the patient had discussed and that her shift is longer than that.  She also is trying to get her shift covered today because she did not go to work today.  She would like someone to give her a call today.  CB#[phone].  Thank you.

## 2018-11-12 ENCOUNTER — Encounter: Payer: Self-pay | Admitting: Orthopedic Surgery

## 2018-11-12 ENCOUNTER — Ambulatory Visit (INDEPENDENT_AMBULATORY_CARE_PROVIDER_SITE_OTHER): Payer: Federal, State, Local not specified - PPO | Admitting: Orthopedic Surgery

## 2018-11-12 VITALS — Ht 63.0 in | Wt 208.0 lb

## 2018-11-12 DIAGNOSIS — M5416 Radiculopathy, lumbar region: Secondary | ICD-10-CM

## 2018-11-12 NOTE — Telephone Encounter (Signed)
Do you have any paperwork on this pt? I do not remember seeing anything on her.

## 2018-11-12 NOTE — Progress Notes (Signed)
Office Visit Note   Patient: Bailey Ward           Date of Birth: 03/12/1966           MRN: [id number] Visit Date: 11/12/2018              Requested by: Overton Mamirigliano, Mary K, DO [address] [place],  [place] [id number] PCP: Overton Mamirigliano, Mary K, DO  Chief Complaint  Patient presents with  . Lower Back - Pain, Follow-up    FMLA Paperwork updated 11/12/2018      HPI: Patient presents in follow-up for her lumbar spine she had questions about her FMLA paperwork.  She states she was out of work yesterday due to back pain and she states that this was denied her FMLA.  Patient is currently working 20 hours a week.  Assessment & Plan: Visit Diagnoses:  1. Lumbar back pain with radiculopathy affecting left lower extremity     Plan: Patient was given a note today that states that yesterday  out of work was consistent with [facility].  The FMLA paperwork was updated to make it more clear.  The initial  paper work stated that she can be out of work for 4 hours per episode up to 3 episodes per week for a year.  The bottom line of the FML paperwork was revised to state that this is 1 to 4 hours out of work per episode.  Follow-Up Instructions: Return if symptoms worsen or fail to improve.   Ortho Exam   No examination was performed patient's paperwork was completed, no charge  Imaging: No results found. No images are attached to the encounter.  Labs: No results found for: HGBA1C, ESRSEDRATE, CRP, LABURIC, REPTSTATUS, GRAMSTAIN, CULT, LABORGA   Lab Results  Component Value Date   ALBUMIN 4.1 08/20/2016   ALBUMIN 4.2 08/22/2015    No results found for: MG Lab Results  Component Value Date   VD25OH 14.47 (L) 05/06/2018   VD25OH 15.86 (L) 08/20/2016   VD25OH 6.74 (L) 08/22/2015    No results found for: PREALBUMIN CBC EXTENDED Latest Ref Rng & Units 08/20/2016 08/22/2015 07/13/2009  WBC 4.0 - 10.5 K/uL 5.8 6.0 7.1  RBC 3.87 - 5.11 Mil/uL 4.74 4.81  3.64(L)  HGB 12.0 - 15.0 g/dL 19.112.8 47.813.0 29.510.4 DELTA CHECK NOTED RESULT REPEATED AND VERIFIED(L)  HCT 36.0 - 46.0 % 39.8 39.5 31.7(L)  PLT 150.0 - 400.0 K/uL 357.0 397.0 234 DELTA CHECK NOTED SPECIMEN CHECKED FOR CLOTS RESULT REPEATED AND VERIFIED     Body mass index is 36.85 kg/m.  Orders:  No orders of the defined types were placed in this encounter.  No orders of the defined types were placed in this encounter.    Procedures: No procedures performed  Clinical Data: No additional findings.  ROS:  All other systems negative, except as noted in the HPI. Review of Systems  Objective: Vital Signs: Ht 5\' 3"  (1.6 m)   Wt 208 lb (94.3 kg)   BMI 36.85 kg/m   Specialty Comments:  No specialty comments available.  PMFS History: Patient Active Problem List   Diagnosis Date Noted  . Idiopathic chronic venous hypertension of both lower extremities with inflammation 07/28/2017  . Chronic pain of left knee 12/04/2016  . Vitamin D deficiency 08/02/2016  . Encounter for general adult medical examination with abnormal findings 08/01/2015  . Obesity 08/01/2015  . Hair loss 09/08/2014  . Essential hypertension 06/24/2014   Past Medical History:  Diagnosis Date  . Allergy   . Hypertension     Family History  Problem Relation Age of Onset  . Hypertension Mother   . Diabetes Father   . Hypertension Father     Past Surgical History:  Procedure Laterality Date  . ABDOMINAL HYSTERECTOMY     Social History   Occupational History  . Occupation: Teacher, English as a foreign language  Tobacco Use  . Smoking status: Never Smoker  . Smokeless tobacco: Never Used  Substance and Sexual Activity  . Alcohol use: No  . Drug use: No  . Sexual activity: Not on file

## 2018-11-12 NOTE — Telephone Encounter (Signed)
I don't recall seeing anything new.

## 2018-11-12 NOTE — Telephone Encounter (Signed)
I called pt and she will come in today at 3:30 she wants an excuse for missing work and states that the update that was made in may to her FMLA needs to be adjusted.

## 2018-11-24 ENCOUNTER — Ambulatory Visit: Payer: Federal, State, Local not specified - PPO | Admitting: Orthopedic Surgery

## 2018-12-17 ENCOUNTER — Ambulatory Visit: Payer: Federal, State, Local not specified - PPO | Admitting: Orthopedic Surgery

## 2018-12-31 ENCOUNTER — Ambulatory Visit: Payer: Federal, State, Local not specified - PPO | Admitting: Orthopedic Surgery

## 2018-12-31 ENCOUNTER — Telehealth: Payer: Self-pay | Admitting: Orthopedic Surgery

## 2018-12-31 NOTE — Telephone Encounter (Signed)
Pt came into the office requesting to have a handicap form filled out for her to get a sticker.    [phone]

## 2018-12-31 NOTE — Telephone Encounter (Signed)
Patient was called and will discuss placard at next visit. Patient came in and rescheduled appt.

## 2019-01-07 ENCOUNTER — Ambulatory Visit (INDEPENDENT_AMBULATORY_CARE_PROVIDER_SITE_OTHER): Payer: Federal, State, Local not specified - PPO | Admitting: Orthopedic Surgery

## 2019-01-07 ENCOUNTER — Other Ambulatory Visit: Payer: Self-pay

## 2019-01-07 ENCOUNTER — Encounter: Payer: Self-pay | Admitting: Orthopedic Surgery

## 2019-01-07 VITALS — Ht 63.0 in | Wt 208.0 lb

## 2019-01-07 DIAGNOSIS — M5416 Radiculopathy, lumbar region: Secondary | ICD-10-CM | POA: Diagnosis not present

## 2019-01-07 DIAGNOSIS — M25562 Pain in left knee: Secondary | ICD-10-CM

## 2019-01-07 DIAGNOSIS — G8929 Other chronic pain: Secondary | ICD-10-CM

## 2019-01-07 NOTE — Progress Notes (Signed)
Office Visit Note   Patient: Bailey Ward           Date of Birth: 07-24-1965           MRN: [id number] Visit Date: 01/07/2019              Requested by: Ronnald Nian, DO [place],  [place] [id number] PCP: Ronnald Nian, DO  Chief Complaint  Patient presents with  . Lower Back - Follow-up  . Left Knee - Follow-up      HPI: Patient is a 53 year old woman presents in follow-up for osteoarthritis knee pain left knee and chronic lower back pain.  She states she has completed her physical therapy and is currently doing a home exercise program.  She uses a lumbar support in her chair.  She states that stretching and heat does help her back.  Patient states her FMLA paperwork has been updated and is current.  Assessment & Plan: Visit Diagnoses:  1. Lumbar back pain with radiculopathy affecting left lower extremity   2. Chronic pain of left knee     Plan: Patient will continue with her current treatment follow-up as needed.  She is given a application for handicap parking.  Follow-Up Instructions: Return if symptoms worsen or fail to improve.   Ortho Exam  Patient is alert, oriented, no adenopathy, well-dressed, normal affect, normal respiratory effort. Examination patient has some mild lower back pain but no radicular symptoms at this time no focal motor weakness in either lower extremity her left knee is asymptomatic today.  Imaging: No results found. No images are attached to the encounter.  Labs: No results found for: HGBA1C, ESRSEDRATE, CRP, LABURIC, REPTSTATUS, GRAMSTAIN, CULT, LABORGA   Lab Results  Component Value Date   ALBUMIN 4.1 08/20/2016   ALBUMIN 4.2 08/22/2015    No results found for: MG Lab Results  Component Value Date   VD25OH 14.47 (L) 05/06/2018   VD25OH 15.86 (L) 08/20/2016   VD25OH 6.74 (L) 08/22/2015    No results found for: PREALBUMIN CBC EXTENDED Latest Ref Rng & Units 08/20/2016 08/22/2015 07/13/2009   WBC 4.0 - 10.5 K/uL 5.8 6.0 7.1  RBC 3.87 - 5.11 Mil/uL 4.74 4.81 3.64(L)  HGB 12.0 - 15.0 g/dL 12.8 13.0 10.4 DELTA CHECK NOTED RESULT REPEATED AND VERIFIED(L)  HCT 36.0 - 46.0 % 39.8 39.5 31.7(L)  PLT 150.0 - 400.0 K/uL 357.0 397.0 234 DELTA CHECK NOTED SPECIMEN CHECKED FOR CLOTS RESULT REPEATED AND VERIFIED     Body mass index is 36.85 kg/m.  Orders:  No orders of the defined types were placed in this encounter.  No orders of the defined types were placed in this encounter.    Procedures: No procedures performed  Clinical Data: No additional findings.  ROS:  All other systems negative, except as noted in the HPI. Review of Systems  Objective: Vital Signs: Ht 5\' 3"  (1.6 m)   Wt 208 lb (94.3 kg)   BMI 36.85 kg/m   Specialty Comments:  No specialty comments available.  PMFS History: Patient Active Problem List   Diagnosis Date Noted  . Idiopathic chronic venous hypertension of both lower extremities with inflammation 07/28/2017  . Chronic pain of left knee 12/04/2016  . Vitamin D deficiency 08/02/2016  . Encounter for general adult medical examination with abnormal findings 08/01/2015  . Obesity 08/01/2015  . Hair loss 09/08/2014  . Essential hypertension 06/24/2014   Past Medical History:  Diagnosis Date  . Allergy   .  Hypertension     Family History  Problem Relation Age of Onset  . Hypertension Mother   . Diabetes Father   . Hypertension Father     Past Surgical History:  Procedure Laterality Date  . ABDOMINAL HYSTERECTOMY     Social History   Occupational History  . Occupation: Patent examiner  Tobacco Use  . Smoking status: Never Smoker  . Smokeless tobacco: Never Used  Substance and Sexual Activity  . Alcohol use: No  . Drug use: No  . Sexual activity: Not on file

## 2019-01-09 IMAGING — MR MR LUMBAR SPINE W/O CM
4 of 5 series · 28 of 48 positions shown · non-contrast
Comparison: None.

CLINICAL DATA: Low back pain with radiculopathy.  Left leg pain

EXAM:
MRI LUMBAR SPINE WITHOUT CONTRAST
TECHNIQUE: Multiplanar, multisequence MR imaging of the lumbar spine was
performed. No intravenous contrast was administered.

[Series 3: T2 post-contrast · sagittal · 4.0mm · 0.55mm/px · 6 of 13 slices shown]
[im 1/13]
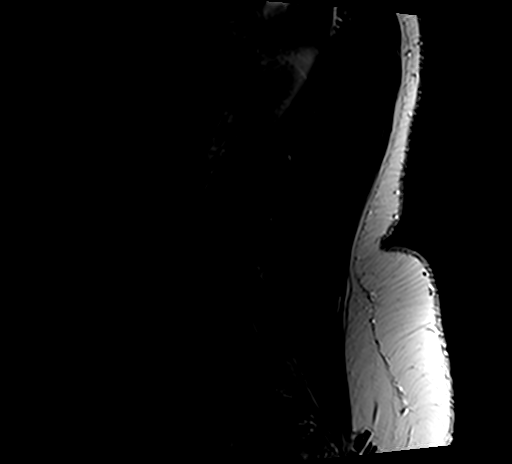
[im 3/13]
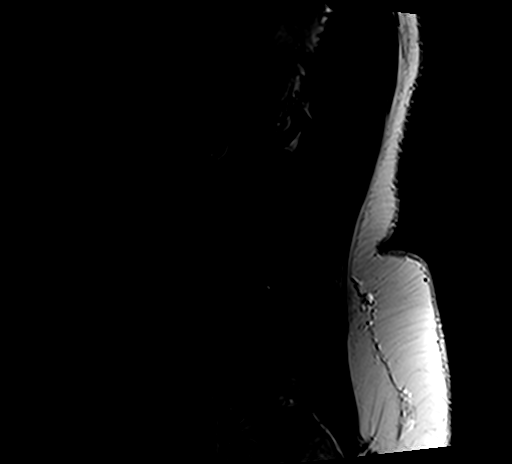
[im 5/13]
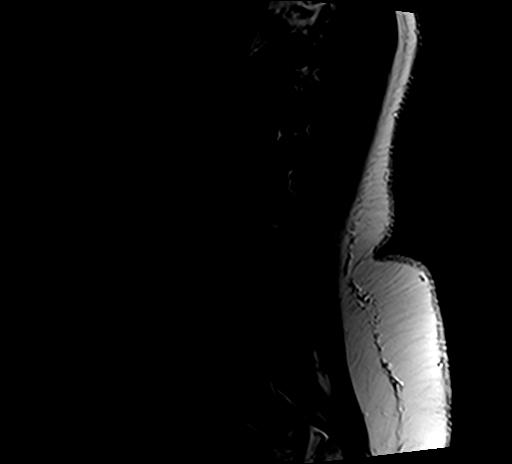
[im 8/13]
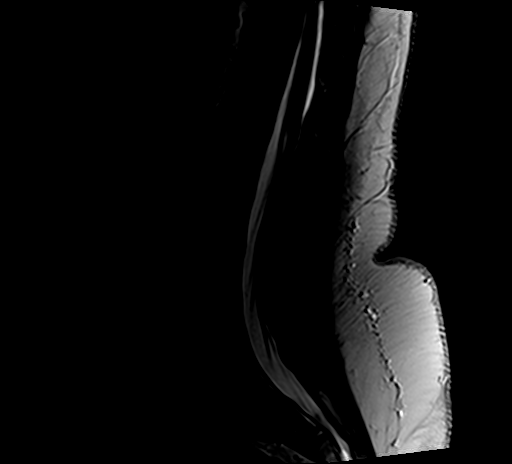
[im 10/13]
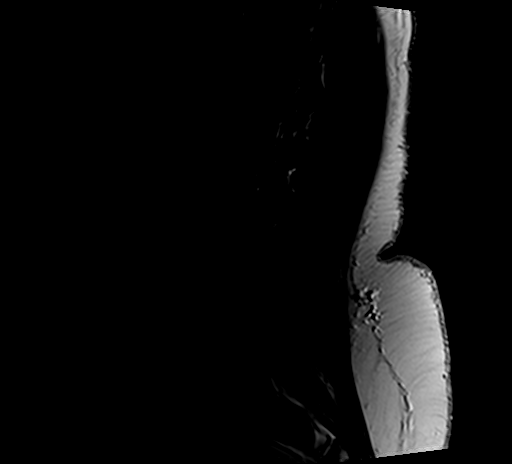
[im 13/13]
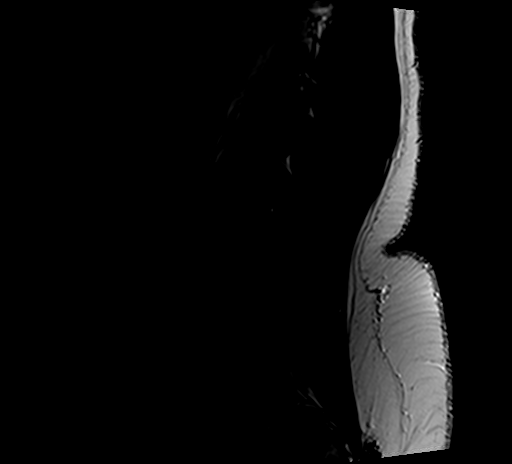

[Series 5: T1 · sagittal · 4.0mm · 0.55mm/px · 6 of 13 slices shown (1 of 2)]
[im 1/13]
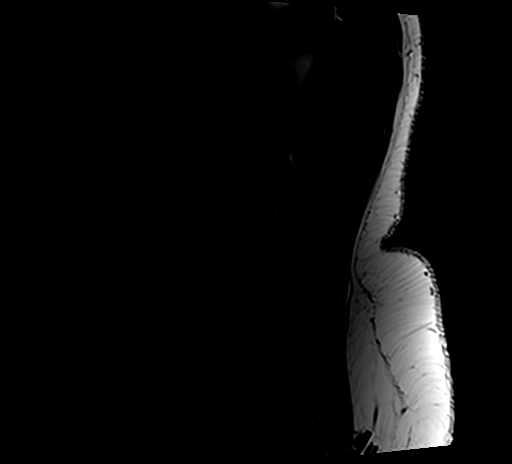
[im 3/13]
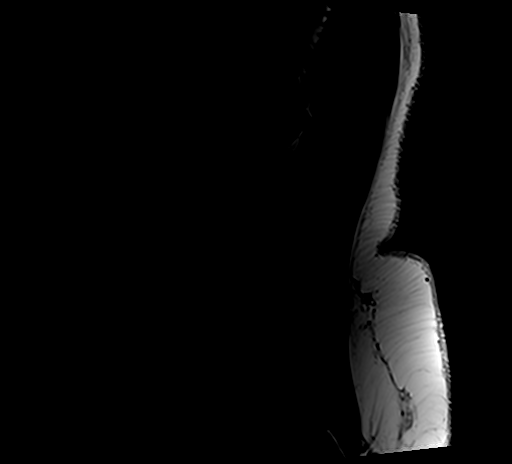
[im 5/13]
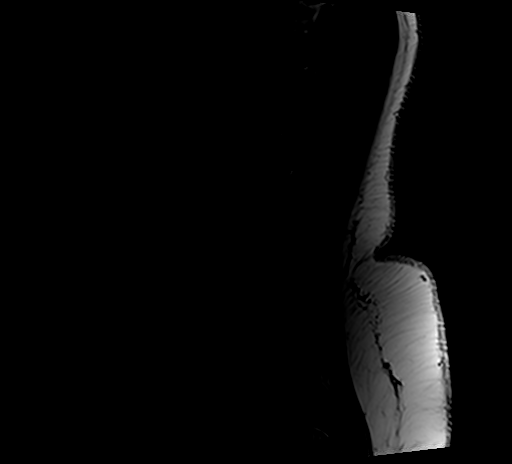
[im 8/13]
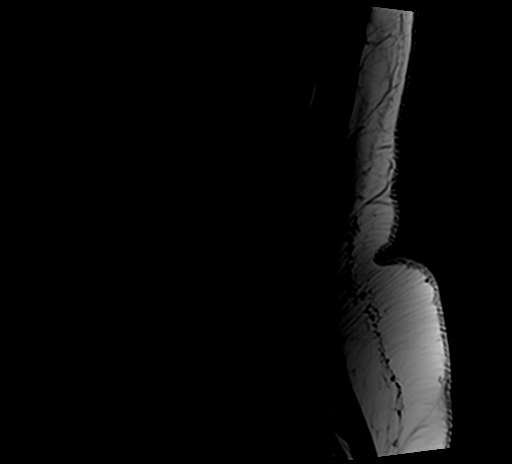
[im 10/13]
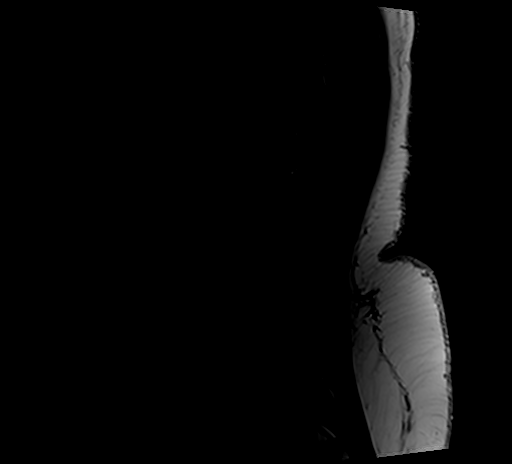
[im 13/13]
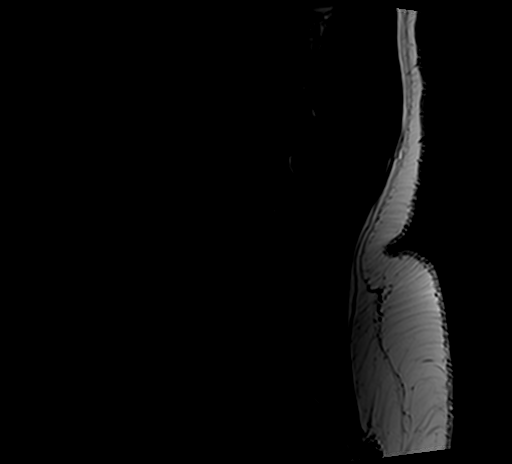

[Series 6: T1 · axial · 4.0mm · 0.35mm/px · z∈[-140,+33]mm · 7 of 32 slices shown (2 of 2)]
[im 1/32]
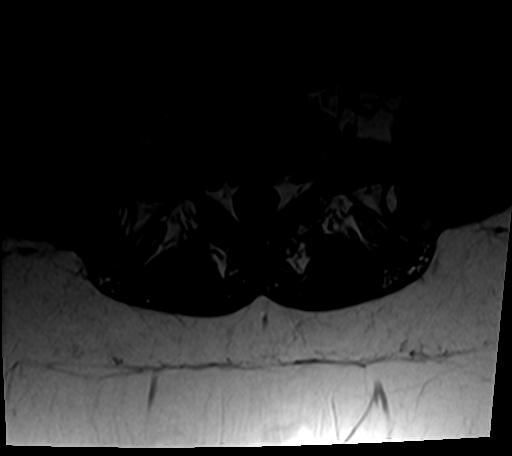
[im 5/32]
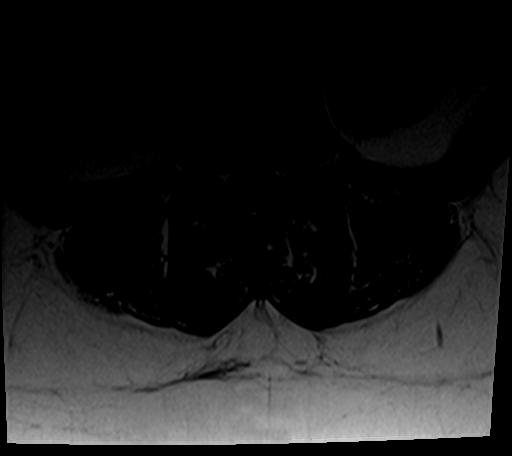
[im 9/32]
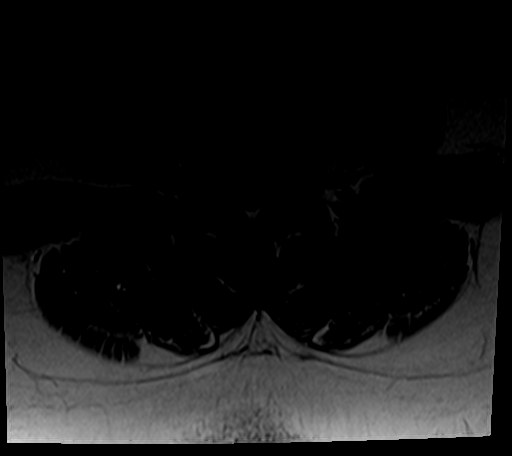
[im 14/32]
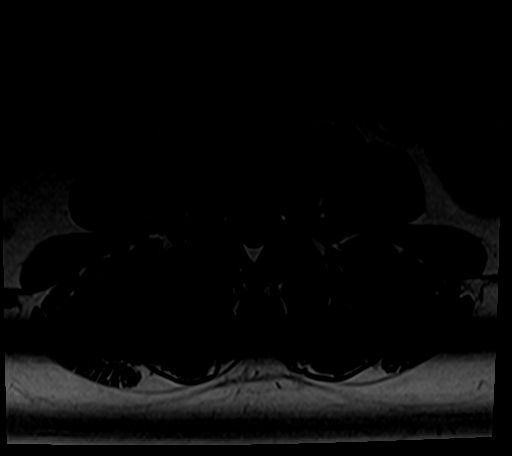
[im 16/32]
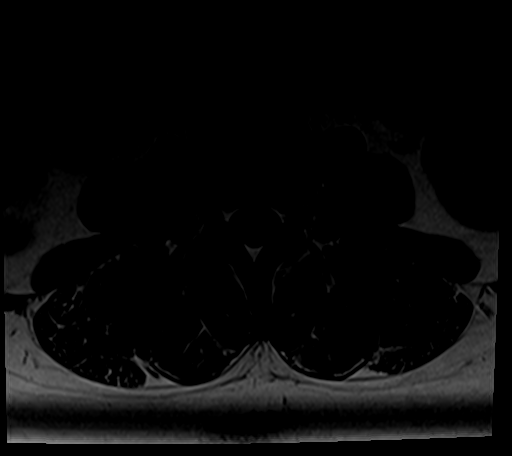
[im 18/32]
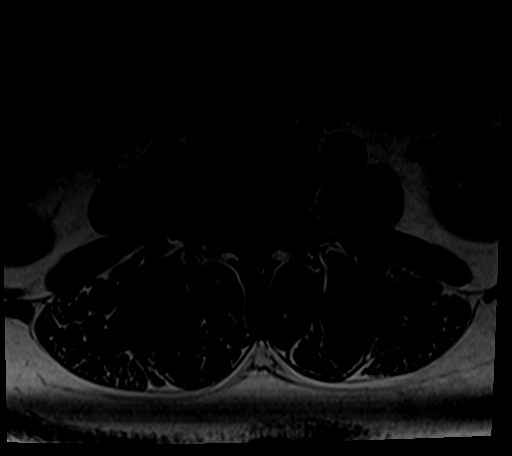
[im 27/32]
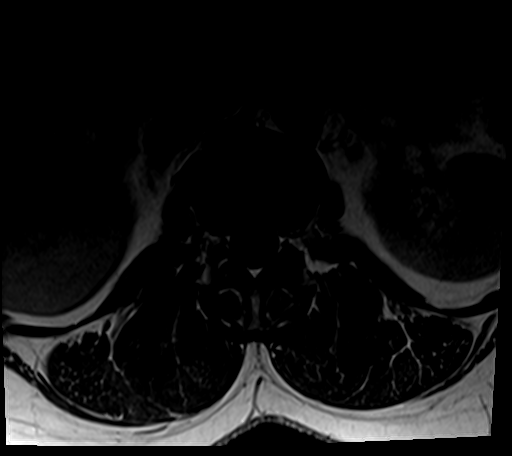

[Series 7: T2 · axial · 4.0mm · 0.70mm/px · z∈[-140,+72]mm · 9 of 32 slices shown]
[im 1/32]
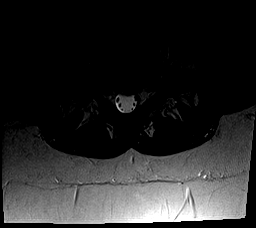
[im 5/32]
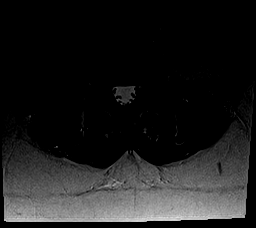
[im 9/32]
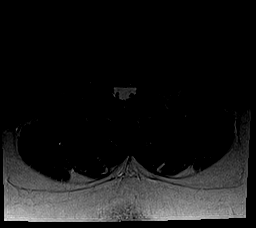
[im 14/32]
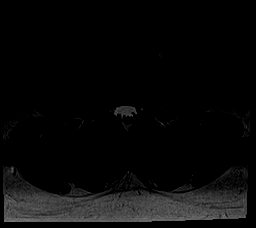
[im 16/32]
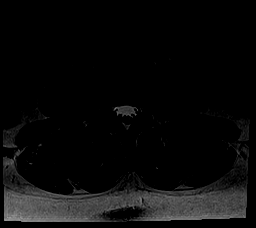
[im 18/32]
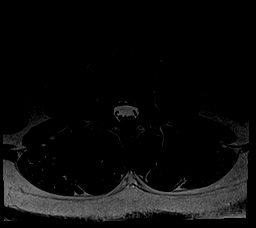
[im 23/32]
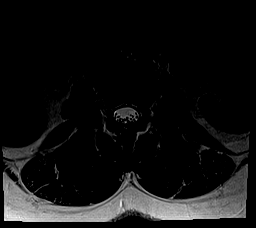
[im 27/32]
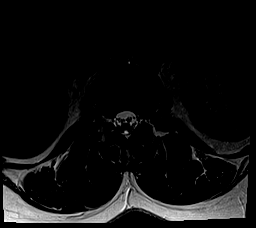
[im 32/32]
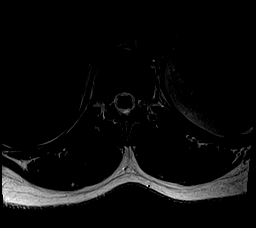

[28 of 48 positions shown; findings below may reference images not displayed]

FINDINGS: Segmentation:  Normal

Alignment:  Normal

Vertebrae:  Normal bone marrow.  Negative for fracture or mass.

Conus medullaris and cauda equina: Conus extends to the L1-2 level.
Conus and cauda equina appear normal.

Paraspinal and other soft tissues: Negative for paraspinous mass or
fluid collection

Disc levels:

L1-2: Negative

L2-3: Negative

L3-4: Mild disc and facet degeneration. Mild disc bulging without
significant stenosis

L4-5: Mild disc and mild facet degeneration. Mild disc bulging
without significant stenosis or disc protrusion

L5-S1: Mild facet degeneration bilaterally. No significant stenosis
or disc protrusion.
IMPRESSION: Mild lumbar degenerative change. No cause for radiculopathy
identified.

## 2019-02-11 DIAGNOSIS — Z124 Encounter for screening for malignant neoplasm of cervix: Secondary | ICD-10-CM | POA: Diagnosis not present

## 2019-02-11 DIAGNOSIS — Z1231 Encounter for screening mammogram for malignant neoplasm of breast: Secondary | ICD-10-CM | POA: Diagnosis not present

## 2019-02-11 DIAGNOSIS — Z01419 Encounter for gynecological examination (general) (routine) without abnormal findings: Secondary | ICD-10-CM | POA: Diagnosis not present

## 2019-02-11 DIAGNOSIS — Z6839 Body mass index (BMI) 39.0-39.9, adult: Secondary | ICD-10-CM | POA: Diagnosis not present

## 2019-02-11 LAB — HM PAP SMEAR: HM Pap smear: NORMAL

## 2019-06-24 ENCOUNTER — Ambulatory Visit: Payer: Federal, State, Local not specified - PPO | Admitting: Orthopedic Surgery

## 2019-06-28 ENCOUNTER — Ambulatory Visit: Payer: Federal, State, Local not specified - PPO | Admitting: Physician Assistant

## 2019-06-28 ENCOUNTER — Other Ambulatory Visit: Payer: Self-pay

## 2019-06-28 ENCOUNTER — Encounter: Payer: Self-pay | Admitting: Orthopedic Surgery

## 2019-06-28 ENCOUNTER — Ambulatory Visit: Payer: Self-pay

## 2019-06-28 VITALS — Ht 63.0 in | Wt 208.0 lb

## 2019-06-28 DIAGNOSIS — M5416 Radiculopathy, lumbar region: Secondary | ICD-10-CM | POA: Diagnosis not present

## 2019-06-28 MED ORDER — PREDNISONE 10 MG PO TABS: 20.0000 mg | ORAL_TABLET | Freq: Every day | ORAL | 0 refills | Status: DC

## 2019-06-28 NOTE — Progress Notes (Signed)
Office Visit Note   Patient: Bailey Ward           Date of Birth: 1965-04-09           MRN: [id number] Visit Date: 06/28/2019              Requested by: Ronnald Nian, DO [place],   [id number] PCP: Ronnald Nian, DO  Chief Complaint  Patient presents with  . Lower Back - Pain      HPI: This is a pleasant 54 year old woman who we have seen in the past for chronic left knee pain and lower back pain that radiates down her leg.  She has had an increase in symptoms over the last 2 to 3 weeks.  She does have the back pain that radiates down to the knee.  She also has some pain in her knee.  She is having difficulty deciding which problem is worse.  She denies any specific radicular symptoms.  She denies any weakness.  Assessment & Plan: Visit Diagnoses:  1. Lumbar back pain with radiculopathy affecting left lower extremity     Plan: I offered her an injection into her knee today.  She has declined this.  Alternatively we can give her a course of prednisone for 3 weeks.  She understands that this may help her knee and back however it will allow [place] to delineate which is the more problematic problem.  She will follow-up at that time she understands to take the medication with food  Follow-Up Instructions: No follow-ups on file.   Ortho Exam  Patient is alert, oriented, no adenopathy, well-dressed, normal affect, normal respiratory effort. Focused examination demonstrates only very mild symptoms with straight leg raise she does have tenderness in her lower back that radiates down to her knee some pain over the medial joint line no effusion or cellulitis is able to fire  her extensors and flexors in her foot  Imaging: No results found. No images are attached to the encounter.  Labs: No results found for: HGBA1C, ESRSEDRATE, CRP, LABURIC, REPTSTATUS, GRAMSTAIN, CULT, LABORGA   Lab Results  Component Value Date   ALBUMIN 4.1 08/20/2016    ALBUMIN 4.2 08/22/2015    No results found for: MG Lab Results  Component Value Date   VD25OH 14.47 (L) 05/06/2018   VD25OH 15.86 (L) 08/20/2016   VD25OH 6.74 (L) 08/22/2015    No results found for: PREALBUMIN CBC EXTENDED Latest Ref Rng & Units 08/20/2016 08/22/2015 07/13/2009  WBC 4.0 - 10.5 K/uL 5.8 6.0 7.1  RBC 3.87 - 5.11 Mil/uL 4.74 4.81 3.64(L)  HGB 12.0 - 15.0 g/dL 12.8 13.0 10.4 DELTA CHECK NOTED RESULT REPEATED AND VERIFIED(L)  HCT 36.0 - 46.0 % 39.8 39.5 31.7(L)  PLT 150.0 - 400.0 K/uL 357.0 397.0 234 DELTA CHECK NOTED SPECIMEN CHECKED FOR CLOTS RESULT REPEATED AND VERIFIED     Body mass index is 36.85 kg/m.  Orders:  Orders Placed This Encounter  Procedures  . XR Lumbar Spine 2-3 Views   Meds ordered this encounter  Medications  . predniSONE (DELTASONE) 10 MG tablet    Sig: Take 2 tablets (20 mg total) by mouth daily with breakfast.    Dispense:  60 tablet    Refill:  0     Procedures: No procedures performed  Clinical Data: No additional findings.  ROS:  All other systems negative, except as noted in the HPI. Review of Systems  Objective: Vital Signs: Ht 5\' 3"  (  1.6 m)   Wt 208 lb (94.3 kg)   BMI 36.85 kg/m   Specialty Comments:  No specialty comments available.  PMFS History: Patient Active Problem List   Diagnosis Date Noted  . Idiopathic chronic venous hypertension of both lower extremities with inflammation 07/28/2017  . Chronic pain of left knee 12/04/2016  . Vitamin D deficiency 08/02/2016  . Encounter for general adult medical examination with abnormal findings 08/01/2015  . Obesity 08/01/2015  . Hair loss 09/08/2014  . Essential hypertension 06/24/2014   Past Medical History:  Diagnosis Date  . Allergy   . Hypertension     Family History  Problem Relation Age of Onset  . Hypertension Mother   . Diabetes Father   . Hypertension Father     Past Surgical History:  Procedure Laterality Date  . ABDOMINAL HYSTERECTOMY      Social History   Occupational History  . Occupation: Patent examiner  Tobacco Use  . Smoking status: Never Smoker  . Smokeless tobacco: Never Used  Substance and Sexual Activity  . Alcohol use: No  . Drug use: No  . Sexual activity: Not on file

## 2019-07-22 ENCOUNTER — Ambulatory Visit: Payer: Federal, State, Local not specified - PPO | Admitting: Orthopedic Surgery

## 2019-08-13 ENCOUNTER — Telehealth: Payer: Federal, State, Local not specified - PPO | Admitting: Family

## 2019-08-13 ENCOUNTER — Other Ambulatory Visit: Payer: Self-pay

## 2019-09-06 ENCOUNTER — Other Ambulatory Visit: Payer: Self-pay | Admitting: Family Medicine

## 2019-09-06 DIAGNOSIS — I10 Essential (primary) hypertension: Secondary | ICD-10-CM

## 2020-05-02 ENCOUNTER — Ambulatory Visit (INDEPENDENT_AMBULATORY_CARE_PROVIDER_SITE_OTHER): Payer: Federal, State, Local not specified - PPO | Admitting: Nurse Practitioner

## 2020-05-02 ENCOUNTER — Encounter: Payer: Self-pay | Admitting: Nurse Practitioner

## 2020-05-02 ENCOUNTER — Other Ambulatory Visit: Payer: Self-pay

## 2020-05-02 VITALS — BP 138/88 | HR 96 | Temp 97.3°F | Ht 63.25 in | Wt 220.2 lb

## 2020-05-02 DIAGNOSIS — Z0001 Encounter for general adult medical examination with abnormal findings: Secondary | ICD-10-CM

## 2020-05-02 DIAGNOSIS — I10 Essential (primary) hypertension: Secondary | ICD-10-CM

## 2020-05-02 DIAGNOSIS — Z13220 Encounter for screening for lipoid disorders: Secondary | ICD-10-CM

## 2020-05-02 DIAGNOSIS — Z1159 Encounter for screening for other viral diseases: Secondary | ICD-10-CM

## 2020-05-02 DIAGNOSIS — Z6838 Body mass index (BMI) 38.0-38.9, adult: Secondary | ICD-10-CM

## 2020-05-02 DIAGNOSIS — E559 Vitamin D deficiency, unspecified: Secondary | ICD-10-CM | POA: Diagnosis not present

## 2020-05-02 DIAGNOSIS — Z90710 Acquired absence of both cervix and uterus: Secondary | ICD-10-CM | POA: Insufficient documentation

## 2020-05-02 DIAGNOSIS — Z136 Encounter for screening for cardiovascular disorders: Secondary | ICD-10-CM

## 2020-05-02 DIAGNOSIS — Z1211 Encounter for screening for malignant neoplasm of colon: Secondary | ICD-10-CM | POA: Diagnosis not present

## 2020-05-02 NOTE — Assessment & Plan Note (Signed)
Start daily exercise: 30-74mins a day, moderate intensity. Maintain Heart healthy diet. Maintain adequate oral hydration with water mostly: 60oz per day.

## 2020-05-02 NOTE — Assessment & Plan Note (Signed)
Repeat vit. D level 

## 2020-05-02 NOTE — Assessment & Plan Note (Signed)
Improved with losartan/HCTZ. Reports home reading: 140s/90s Has not made changes to her diet, no exercise BP Readings from Last 3 Encounters:  05/02/20 138/88  06/25/18 (!) 162/102  05/06/18 (!) 160/100   Advised about the importance of diet and exercise compliance in combination to medication use. Repeat CMP Maintain current medication F/up with pcp in 90months

## 2020-05-02 NOTE — Progress Notes (Signed)
Subjective:    Patient ID: Bailey Ward, female    DOB: 1965-05-21, 55 y.o.   MRN: [id number]  Patient presents today for CPE and eval of chronic conditions  HPI Vitamin D deficiency Repeat vit D level  Hypertensive disorder Improved with losartan/HCTZ. Reports home reading: 140s/90s Has not made changes to her diet, no exercise BP Readings from Last 3 Encounters:  05/02/20 138/88  06/25/18 (!) 162/102  05/06/18 (!) 160/100   Advised about the importance of diet and exercise compliance in combination to medication use. Repeat CMP Maintain current medication F/up with pcp in 26months  Obesity Start daily exercise: 30-76mins a day, moderate intensity. Maintain Heart healthy diet. Maintain adequate oral hydration with water mostly: 60oz per day.  Sexual History (orientation,birth control, marital status, STD):s/p hysterectomy, deferred breast and pelvic exam to GYN, has appt 05/03/2020  Depression/Suicide: Depression screen [facility] 2/9 05/02/2020 05/06/2018 08/01/2015  Decreased Interest 0 0 0  Down, Depressed, Hopeless 0 0 0  PHQ - 2 Score 0 0 0   Vision:will schedule appt  Dental:will schedule appt  Immunizations: (TDAP, Hep C screen, Pneumovax, Influenza, zoster)  Health Maintenance  Topic Date Due  .  Hepatitis C: One time screening is recommended by Center for Disease Control  (CDC) for  adults born from [birth year] through 1965.   Never done  . COVID-19 Vaccine (1) Never done  . Colon Cancer Screening  Never done  . Pap Smear  03/22/2018  . Mammogram  05/24/2018  . Flu Shot  07/06/2020*  . HIV Screening  05/02/2021*  . Tetanus Vaccine  08/10/2020  *Topic was postponed. The date shown is not the original due date.   Diet:regular Exercise: none Weight:  Wt Readings from Last 3 Encounters:  05/02/20 220 lb 3.2 oz (99.9 kg)  06/28/19 208 lb (94.3 kg)  01/07/19 208 lb (94.3 kg)   Fall Risk: Fall Risk  05/02/2020 05/06/2018 08/01/2015  Falls in the past year? 0 0  No  Number falls in past yr: 0 - -  Injury with Fall? 0 - -  Risk for fall due to : No Fall Risks - -  Follow up Falls evaluation completed - -   Medications and allergies reviewed with patient and updated if appropriate.  Patient Active Problem List   Diagnosis Date Noted  . S/P hysterectomy 05/02/2020  . Idiopathic chronic venous hypertension of both lower extremities with inflammation 07/28/2017  . Chronic pain of left knee 12/04/2016  . Vitamin D deficiency 08/02/2016  . Encounter for general adult medical examination with abnormal findings 08/01/2015  . Obesity 08/01/2015  . Hair loss 09/08/2014  . Hypertensive disorder 06/24/2014    Current Outpatient Medications on File Prior to Visit  Medication Sig Dispense Refill  . cetirizine (ZYRTEC) 10 MG tablet Take 10 mg by mouth daily as needed for allergies (only in allergy season).    Marland Kitchen losartan-hydrochlorothiazide (HYZAAR) 100-25 MG tablet TAKE 1 TABLET BY MOUTH EVERY DAY 90 tablet 3   No current facility-administered medications on file prior to visit.    Past Medical History:  Diagnosis Date  . Allergy   . Hypertension     Past Surgical History:  Procedure Laterality Date  . ABDOMINAL HYSTERECTOMY      Social History   Socioeconomic History  . Marital status: Married    Spouse name: Not on file  . Number of children: 2  . Years of education: 29  . Highest education level: Not on file  Occupational History  . Occupation: Patent examiner  Tobacco Use  . Smoking status: Never Smoker  . Smokeless tobacco: Never Used  Vaping Use  . Vaping Use: Never used  Substance and Sexual Activity  . Alcohol use: No  . Drug use: No  . Sexual activity: Not on file  Other Topics Concern  . Not on file  Social History Narrative   Born in [place], [place] and raised in [place]. Fun: Travel, go to [employer], watch her daughter play soccer, watch movies   Denies religious beliefs that would effect health care.    Denies abuse and  feels safe at home.    Social Determinants of Health   Financial Resource Strain: Not on file  Food Insecurity: Not on file  Transportation Needs: Not on file  Physical Activity: Not on file  Stress: Not on file  Social Connections: Not on file    Family History  Problem Relation Age of Onset  . Hypertension Mother   . Diabetes Father   . Hypertension Father         Review of Systems  Constitutional: Negative for fever, malaise/fatigue and weight loss.  HENT: Negative for congestion and sore throat.   Eyes:       Negative for visual changes  Respiratory: Negative for cough and shortness of breath.   Cardiovascular: Positive for leg swelling. Negative for chest pain, palpitations, orthopnea, claudication and PND.  Gastrointestinal: Negative for blood in stool, constipation, diarrhea and heartburn.  Genitourinary: Negative for dysuria, frequency and urgency.  Musculoskeletal: Negative for falls, joint pain and myalgias.  Skin: Negative for rash.  Neurological: Negative for dizziness, sensory change and headaches.  Endo/Heme/Allergies: Does not bruise/bleed easily.  Psychiatric/Behavioral: Negative for depression, substance abuse and suicidal ideas. The patient is not nervous/anxious.     Objective:   Vitals:   05/02/20 1306  BP: 138/88  Pulse: 96  Temp: (!) 97.3 F (36.3 C)  SpO2: 98%    Body mass index is 38.7 kg/m.   Physical Examination:  Physical Exam Vitals reviewed.  Constitutional:      General: She is not in acute distress.    Appearance: She is obese.  HENT:     Right Ear: Tympanic membrane, ear canal and external ear normal.     Left Ear: Tympanic membrane, ear canal and external ear normal.     Mouth/Throat:     Mouth: Oropharynx is clear and moist.  Eyes:     General: No scleral icterus.    Extraocular Movements: Extraocular movements intact and EOM normal.     Conjunctiva/sclera: Conjunctivae normal.  Neck:     Thyroid: No thyromegaly.   Cardiovascular:     Rate and Rhythm: Normal rate and regular rhythm.     Pulses: Normal pulses and intact distal pulses.     Heart sounds: Normal heart sounds.  Pulmonary:     Effort: Pulmonary effort is normal.     Breath sounds: Normal breath sounds.  Chest:     Chest wall: No tenderness.  Abdominal:     General: Bowel sounds are normal. There is no distension.     Palpations: Abdomen is soft.     Tenderness: There is no abdominal tenderness.  Genitourinary:    Comments: Deferred breast and pelvic exam to GYN per patient Musculoskeletal:        General: No tenderness. Normal range of motion.     Cervical back: Normal range of motion and neck supple.  Right lower leg: Edema present.     Left lower leg: Edema present.  Lymphadenopathy:     Cervical: No cervical adenopathy.  Skin:    General: Skin is warm and dry.  Neurological:     Mental Status: She is alert and oriented to person, place, and time.  Psychiatric:        Mood and Affect: Mood normal.        Behavior: Behavior normal.        Thought Content: Thought content normal.        Judgment: Judgment normal.    ASSESSMENT and PLAN: This visit occurred during the SARS-CoV-2 public health emergency.  Safety protocols were in place, including screening questions prior to the visit, additional usage of staff PPE, and extensive cleaning of exam room while observing appropriate contact time as indicated for disinfecting solutions.   Karinne was seen today for annual exam.  Diagnoses and all orders for this visit:  Encounter for preventative adult health care exam with abnormal findings -     CBC; Future -     Comprehensive metabolic panel; Future -     Lipid panel; Future -     Ambulatory referral to Gastroenterology  Essential hypertension  Class 2 severe obesity due to excess calories with serious comorbidity and body mass index (BMI) of 38.0 to 38.9 in adult [facility])  Encounter for lipid screening for cardiovascular  disease -     Lipid panel; Future  Vitamin D deficiency -     Vitamin D 1,25 dihydroxy; Future  Screening for colon cancer -     Ambulatory referral to Gastroenterology  Encounter for hepatitis C screening test for low risk patient -     Hepatitis C Antibody; Future  Go to [facility] on [address] for fasting blood draw (need to be fasting at least 6-8hrs prior to blood draw). Have PAP smear and mammogram report faxed to Dr. Salena Saner once completed. Schedule annual appt for eye exam and dental cleaning Start daily exercise: 30-63mins a day, moderate intensity Maintain Heart healthy diet. Maintain adequate oral hydration with water mostly: 60oz per day You will be contacted to schedule appt with GI for colonoscopy.    Problem List Items Addressed This Visit      Cardiovascular and Mediastinum   Hypertensive disorder    Improved with losartan/HCTZ. Reports home reading: 140s/90s Has not made changes to her diet, no exercise BP Readings from Last 3 Encounters:  05/02/20 138/88  06/25/18 (!) 162/102  05/06/18 (!) 160/100   Advised about the importance of diet and exercise compliance in combination to medication use. Repeat CMP Maintain current medication F/up with pcp in 69months        Other   Obesity    Start daily exercise: 30-29mins a day, moderate intensity. Maintain Heart healthy diet. Maintain adequate oral hydration with water mostly: 60oz per day.      Vitamin D deficiency    Repeat vit D level      Relevant Orders   Vitamin D 1,25 dihydroxy    Other Visit Diagnoses    Encounter for preventative adult health care exam with abnormal findings    -  Primary   Relevant Orders   CBC   Comprehensive metabolic panel   Lipid panel   Ambulatory referral to Gastroenterology   Encounter for lipid screening for cardiovascular disease       Relevant Orders   Lipid panel   Screening for colon cancer  Relevant Orders   Ambulatory referral to Gastroenterology    Encounter for hepatitis C screening test for low risk patient       Relevant Orders   Hepatitis C Antibody      Follow up: Return in about 3 months (around 07/31/2020) for HTN with Dr. Darletta Moll.  Libbie Bartley, NP

## 2020-05-02 NOTE — Patient Instructions (Addendum)
Go to [facility] on [address] for fasting blood draw (need to be fasting at least 6-8hrs prior to blood draw). Have PAP smear and mammogram report faxed to Dr. Salena Saner once completed. Schedule annual appt for eye exam and dental cleaning Start daily exercise: 30-30mins a day, moderate intensity Maintain Heart healthy diet. Maintain adequate oral hydration with water mostly: 60oz per day You will be contacted to schedule appt with GI for colonoscopy.  Preventive Care 55-26 Years Old, Female Preventive care refers to lifestyle choices and visits with your health care provider that can promote health and wellness. This includes:  A yearly physical exam. This is also called an annual wellness visit.  Regular dental and eye exams.  Immunizations.  Screening for certain conditions.  Healthy lifestyle choices, such as: ? Eating a healthy diet. ? Getting regular exercise. ? Not using drugs or products that contain nicotine and tobacco. ? Limiting alcohol use. What can I expect for my preventive care visit? Physical exam Your health care provider will check your:  Height and weight. These may be used to calculate your BMI (body mass index). BMI is a measurement that tells if you are at a healthy weight.  Heart rate and blood pressure.  Body temperature.  Skin for abnormal spots. Counseling Your health care provider may ask you questions about your:  Past medical problems.  Family's medical history.  Alcohol, tobacco, and drug use.  Emotional well-being.  Home life and relationship well-being.  Sexual activity.  Diet, exercise, and sleep habits.  Work and work Astronomer.  Access to firearms.  Method of birth control.  Menstrual cycle.  Pregnancy history. What immunizations do I need? Vaccines are usually given at various ages, according to a schedule. Your health care provider will recommend vaccines for you based on your age, medical history, and lifestyle or other  factors, such as travel or where you work.   What tests do I need? Blood tests  Lipid and cholesterol levels. These may be checked every 5 years, or more often if you are over 55 years old.  Hepatitis C test.  Hepatitis B test. Screening  Lung cancer screening. You may have this screening every year starting at age 55 if you have a 30-pack-year history of smoking and currently smoke or have quit within the past 15 years.  Colorectal cancer screening. ? All adults should have this screening starting at age 55 and continuing until age 21. ? Your health care provider may recommend screening at age 55 if you are at increased risk. ? You will have tests every 1-10 years, depending on your results and the type of screening test.  Diabetes screening. ? This is done by checking your blood sugar (glucose) after you have not eaten for a while (fasting). ? You may have this done every 1-3 years.  Mammogram. ? This may be done every 1-2 years. ? Talk with your health care provider about when you should start having regular mammograms. This may depend on whether you have a family history of breast cancer.  BRCA-related cancer screening. This may be done if you have a family history of breast, ovarian, tubal, or peritoneal cancers.  Pelvic exam and Pap test. ? This may be done every 3 years starting at age 55. ? Starting at age 23, this may be done every 5 years if you have a Pap test in combination with an HPV test. Other tests  STD (sexually transmitted disease) testing, if you are at risk.  Bone density scan. This is done to screen for osteoporosis. You may have this scan if you are at high risk for osteoporosis. Talk with your health care provider about your test results, treatment options, and if necessary, the need for more tests. Follow these instructions at home: Eating and drinking  Eat a diet that includes fresh fruits and vegetables, whole grains, lean protein, and low-fat dairy  products.  Take vitamin and mineral supplements as recommended by your health care provider.  Do not drink alcohol if: ? Your health care provider tells you not to drink. ? You are pregnant, may be pregnant, or are planning to become pregnant.  If you drink alcohol: ? Limit how much you have to 0-1 drink a day. ? Be aware of how much alcohol is in your drink. In the [place] one drink equals one 12 oz bottle of beer (355 mL), one 5 oz glass of wine (148 mL), or one 1 oz glass of hard liquor (44 mL).   Lifestyle  Take daily care of your teeth and gums. Brush your teeth every morning and night with fluoride toothpaste. Floss one time each day.  Stay active. Exercise for at least 30 minutes 5 or more days each week.  Do not use any products that contain nicotine or tobacco, such as cigarettes, e-cigarettes, and chewing tobacco. If you need help quitting, ask your health care provider.  Do not use drugs.  If you are sexually active, practice safe sex. Use a condom or other form of protection to prevent STIs (sexually transmitted infections).  If you do not wish to become pregnant, use a form of birth control. If you plan to become pregnant, see your health care provider for a prepregnancy visit.  If told by your health care provider, take low-dose aspirin daily starting at age 55.  Find healthy ways to cope with stress, such as: ? Meditation, yoga, or listening to music. ? Journaling. ? Talking to a trusted person. ? Spending time with friends and family. Safety  Always wear your seat belt while driving or riding in a vehicle.  Do not drive: ? If you have been drinking alcohol. Do not ride with someone who has been drinking. ? When you are tired or distracted. ? While texting.  Wear a helmet and other protective equipment during sports activities.  If you have firearms in your house, make sure you follow all gun safety procedures. What's next?  Visit your health care provider  once a year for an annual wellness visit.  Ask your health care provider how often you should have your eyes and teeth checked.  Stay up to date on all vaccines. This information is not intended to replace advice given to you by your health care provider. Make sure you discuss any questions you have with your health care provider. Document Revised: 12/28/2019 Document Reviewed: 12/04/2017 Elsevier Patient Education  2021 [employer].

## 2020-05-03 ENCOUNTER — Other Ambulatory Visit (INDEPENDENT_AMBULATORY_CARE_PROVIDER_SITE_OTHER): Payer: Federal, State, Local not specified - PPO

## 2020-05-03 DIAGNOSIS — Z1159 Encounter for screening for other viral diseases: Secondary | ICD-10-CM | POA: Diagnosis not present

## 2020-05-03 DIAGNOSIS — E559 Vitamin D deficiency, unspecified: Secondary | ICD-10-CM | POA: Diagnosis not present

## 2020-05-03 DIAGNOSIS — Z136 Encounter for screening for cardiovascular disorders: Secondary | ICD-10-CM

## 2020-05-03 DIAGNOSIS — Z0001 Encounter for general adult medical examination with abnormal findings: Secondary | ICD-10-CM

## 2020-05-03 DIAGNOSIS — Z13220 Encounter for screening for lipoid disorders: Secondary | ICD-10-CM

## 2020-05-03 LAB — COMPREHENSIVE METABOLIC PANEL
ALT: 7 U/L (ref 0–35)
AST: 13 U/L (ref 0–37)
Albumin: 4.2 g/dL (ref 3.5–5.2)
Alkaline Phosphatase: 72 U/L (ref 39–117)
BUN: 11 mg/dL (ref 6–23)
CO2: 31 mEq/L (ref 19–32)
Calcium: 10 mg/dL (ref 8.4–10.5)
Chloride: 99 mEq/L (ref 96–112)
Creatinine, Ser: 1.2 mg/dL (ref 0.40–1.20)
GFR: 51.39 mL/min — ABNORMAL LOW (ref >60.00)
Glucose, Bld: 94 mg/dL (ref 70–99)
Potassium: 4 mEq/L (ref 3.5–5.1)
Sodium: 136 mEq/L (ref 135–145)
Total Bilirubin: 0.5 mg/dL (ref 0.2–1.2)
Total Protein: 7.9 g/dL (ref 6.0–8.3)

## 2020-05-03 LAB — CBC
HCT: 39.9 % (ref 36.0–46.0)
Hemoglobin: 13.2 g/dL (ref 12.0–15.0)
MCHC: 33 g/dL (ref 30.0–36.0)
MCV: 83.9 fl (ref 78.0–100.0)
Platelets: 333 10*3/uL (ref 150.0–400.0)
RBC: 4.76 Mil/uL (ref 3.87–5.11)
RDW: 14.6 % (ref 11.5–15.5)
WBC: 5 10*3/uL (ref 4.0–10.5)

## 2020-05-03 LAB — LIPID PANEL
Cholesterol: 189 mg/dL (ref 0–200)
HDL: 35 mg/dL — ABNORMAL LOW (ref >39.00)
LDL Cholesterol: 117 mg/dL — ABNORMAL HIGH (ref 0–99)
NonHDL: 153.5
Total CHOL/HDL Ratio: 5
Triglycerides: 185 mg/dL — ABNORMAL HIGH (ref 0.0–149.0)
VLDL: 37 mg/dL (ref 0.0–40.0)

## 2020-05-04 LAB — HEPATITIS C ANTIBODY
Hepatitis C Ab: NONREACTIVE
SIGNAL TO CUT-OFF: 0.01 (ref <1.00)

## 2020-05-07 LAB — VITAMIN D 1,25 DIHYDROXY
Vitamin D 1, 25 (OH)2 Total: 57 pg/mL (ref 18–72)
Vitamin D2 1, 25 (OH)2: <8 pg/mL
Vitamin D3 1, 25 (OH)2: 57 pg/mL

## 2020-07-27 ENCOUNTER — Other Ambulatory Visit: Payer: Self-pay

## 2020-07-27 ENCOUNTER — Encounter (HOSPITAL_BASED_OUTPATIENT_CLINIC_OR_DEPARTMENT_OTHER): Payer: Self-pay | Admitting: Emergency Medicine

## 2020-07-27 ENCOUNTER — Emergency Department (HOSPITAL_BASED_OUTPATIENT_CLINIC_OR_DEPARTMENT_OTHER): Payer: Federal, State, Local not specified - PPO

## 2020-07-27 ENCOUNTER — Ambulatory Visit
Admission: EM | Admit: 2020-07-27 | Discharge: 2020-07-27 | Disposition: A | Discharge status: Home / Self Care | Payer: Federal, State, Local not specified - PPO | Attending: Emergency Medicine | Admitting: Emergency Medicine

## 2020-07-27 ENCOUNTER — Emergency Department (HOSPITAL_BASED_OUTPATIENT_CLINIC_OR_DEPARTMENT_OTHER)
Admission: EM | Admit: 2020-07-27 | Discharge: 2020-07-27 | Disposition: A | Discharge status: Home / Self Care | Payer: Federal, State, Local not specified - PPO | Attending: Emergency Medicine | Admitting: Emergency Medicine

## 2020-07-27 DIAGNOSIS — R079 Chest pain, unspecified: Secondary | ICD-10-CM

## 2020-07-27 DIAGNOSIS — R072 Precordial pain: Secondary | ICD-10-CM | POA: Diagnosis not present

## 2020-07-27 DIAGNOSIS — I10 Essential (primary) hypertension: Secondary | ICD-10-CM | POA: Diagnosis not present

## 2020-07-27 DIAGNOSIS — Z79899 Other long term (current) drug therapy: Secondary | ICD-10-CM | POA: Diagnosis not present

## 2020-07-27 DIAGNOSIS — I160 Hypertensive urgency: Secondary | ICD-10-CM

## 2020-07-27 DIAGNOSIS — R0789 Other chest pain: Secondary | ICD-10-CM | POA: Diagnosis not present

## 2020-07-27 LAB — CBC WITH DIFFERENTIAL/PLATELET
Abs Immature Granulocytes: 0.02 10*3/uL (ref 0.00–0.07)
Basophils Absolute: 0 10*3/uL (ref 0.0–0.1)
Basophils Relative: 1 %
Eosinophils Absolute: 0.1 10*3/uL (ref 0.0–0.5)
Eosinophils Relative: 3 %
HCT: 43.3 % (ref 36.0–46.0)
Hemoglobin: 13.9 g/dL (ref 12.0–15.0)
Immature Granulocytes: 0 %
Lymphocytes Relative: 38 %
Lymphs Abs: 1.9 10*3/uL (ref 0.7–4.0)
MCH: 27.3 pg (ref 26.0–34.0)
MCHC: 32.1 g/dL (ref 30.0–36.0)
MCV: 85.1 fL (ref 80.0–100.0)
Monocytes Absolute: 0.4 10*3/uL (ref 0.1–1.0)
Monocytes Relative: 8 %
Neutro Abs: 2.6 10*3/uL (ref 1.7–7.7)
Neutrophils Relative %: 50 %
Platelets: 375 10*3/uL (ref 150–400)
RBC: 5.09 MIL/uL (ref 3.87–5.11)
RDW: 14.2 % (ref 11.5–15.5)
WBC: 5.1 10*3/uL (ref 4.0–10.5)
nRBC: 0 % (ref 0.0–0.2)

## 2020-07-27 LAB — BASIC METABOLIC PANEL
Anion gap: 11 (ref 5–15)
BUN: 12 mg/dL (ref 6–20)
CO2: 26 mmol/L (ref 22–32)
Calcium: 9.7 mg/dL (ref 8.9–10.3)
Chloride: 98 mmol/L (ref 98–111)
Creatinine, Ser: 1.21 mg/dL — ABNORMAL HIGH (ref 0.44–1.00)
GFR, Estimated: 53 mL/min — ABNORMAL LOW (ref >60)
Glucose, Bld: 93 mg/dL (ref 70–99)
Potassium: 3.4 mmol/L — ABNORMAL LOW (ref 3.5–5.1)
Sodium: 135 mmol/L (ref 135–145)

## 2020-07-27 LAB — TROPONIN I (HIGH SENSITIVITY): Troponin I (High Sensitivity): 4 ng/L (ref <18)

## 2020-07-27 MED ORDER — AMLODIPINE BESYLATE 5 MG PO TABS
5.0000 mg | ORAL_TABLET | Freq: Once | ORAL | Status: AC
  Administered 2020-07-27: 5 mg via ORAL
  Filled 2020-07-27: qty 1

## 2020-07-27 MED ORDER — ALUM & MAG HYDROXIDE-SIMETH 200-200-20 MG/5ML PO SUSP
30.0000 mL | Freq: Once | ORAL | Status: AC
  Administered 2020-07-27: 30 mL via ORAL
  Filled 2020-07-27: qty 30

## 2020-07-27 MED ORDER — LABETALOL HCL 5 MG/ML IV SOLN
10.0000 mg | Freq: Once | INTRAVENOUS | Status: AC
  Administered 2020-07-27: 10 mg via INTRAVENOUS
  Filled 2020-07-27: qty 4

## 2020-07-27 MED ORDER — OMEPRAZOLE MAGNESIUM 20 MG PO TBEC: 20.0000 mg | DELAYED_RELEASE_TABLET | Freq: Every day | ORAL | 0 refills | Status: DC

## 2020-07-27 MED ORDER — AMLODIPINE BESYLATE 5 MG PO TABS: 5.0000 mg | ORAL_TABLET | Freq: Every day | ORAL | 0 refills | Status: DC

## 2020-07-27 NOTE — Discharge Instructions (Addendum)
Follow-up with your primary care doctor.  Please return if symptoms worsen.  Stop medication for reflux symptoms.

## 2020-07-27 NOTE — ED Triage Notes (Signed)
Pt c/o dull ache in high central chest since yesterday. Denies any new shob. Pt states was without her losartan for several days 3 days ago.

## 2020-07-27 NOTE — ED Notes (Signed)
In to round on client, NBP reassessed in rt forearm with different size cuff. NBP still > , ED Provider informed.

## 2020-07-27 NOTE — ED Provider Notes (Addendum)
[facility] EMERGENCY DEPARTMENT Provider Note   CSN: [id number] Arrival date & time: 07/27/20  1142     History Chief Complaint  Patient presents with  . Chest Pain    Bailey Ward is a 55 y.o. female.  The history is provided by the patient.  Chest Pain Pain location:  Substernal area Pain quality: burning   Pain severity:  Mild Onset quality:  Gradual Duration:  1 day Progression:  Unchanged Chronicity:  New Context: eating and at rest   Context comment:  Blood pressure high, hasnt been taking BP meds Relieved by:  Nothing Worsened by:  Nothing Associated symptoms: no abdominal pain, no back pain, no cough, no fever, no palpitations, no shortness of breath and no vomiting   Risk factors: hypertension   Risk factors: no coronary artery disease, no diabetes mellitus, no high cholesterol, no prior DVT/PE and no smoking        Past Medical History:  Diagnosis Date  . Allergy   . Hypertension     Patient Active Problem List   Diagnosis Date Noted  . S/P hysterectomy 05/02/2020  . Idiopathic chronic venous hypertension of both lower extremities with inflammation 07/28/2017  . Chronic pain of left knee 12/04/2016  . Vitamin D deficiency 08/02/2016  . Encounter for general adult medical examination with abnormal findings 08/01/2015  . Obesity 08/01/2015  . Hair loss 09/08/2014  . Hypertensive disorder 06/24/2014    Past Surgical History:  Procedure Laterality Date  . ABDOMINAL HYSTERECTOMY       OB History   No obstetric history on file.     Family History  Problem Relation Age of Onset  . Hypertension Mother   . Diabetes Father   . Hypertension Father     Social History   Tobacco Use  . Smoking status: Never Smoker  . Smokeless tobacco: Never Used  Vaping Use  . Vaping Use: Never used  Substance Use Topics  . Alcohol use: No  . Drug use: No    Home Medications Prior to Admission medications   Medication Sig Start Date  End Date Taking? Authorizing Provider  omeprazole (PRILOSEC OTC) 20 MG tablet Take 1 tablet (20 mg total) by mouth daily for 14 days. 07/27/20 08/10/20 Yes Swati Granberry, DO  cetirizine (ZYRTEC) 10 MG tablet Take 10 mg by mouth daily as needed for allergies (only in allergy season).    [provider]  losartan-hydrochlorothiazide (HYZAAR) 100-25 MG tablet TAKE 1 TABLET BY MOUTH EVERY DAY 09/07/19   Overton Mam, DO    Allergies    Patient has no known allergies.  Review of Systems   Review of Systems  Constitutional: Negative for chills and fever.  HENT: Negative for ear pain and sore throat.   Eyes: Negative for pain and visual disturbance.  Respiratory: Negative for cough and shortness of breath.   Cardiovascular: Positive for chest pain. Negative for palpitations.  Gastrointestinal: Negative for abdominal pain and vomiting.  Genitourinary: Negative for dysuria and hematuria.  Musculoskeletal: Negative for arthralgias and back pain.  Skin: Negative for color change and rash.  Neurological: Negative for seizures and syncope.  All other systems reviewed and are negative.   Physical Exam Updated Vital Signs BP (!) 185/99   Pulse 70   Temp 98.3 F (36.8 C)   Resp (!) 21   Ht 5\' 3"  (1.6 m)   Wt 96.8 kg   SpO2 98%   BMI 37.82 kg/m   Physical Exam  Vitals and nursing note reviewed.  Constitutional:      General: She is not in acute distress.    Appearance: She is well-developed. She is not ill-appearing.  HENT:     Head: Normocephalic and atraumatic.  Eyes:     Conjunctiva/sclera: Conjunctivae normal.     Pupils: Pupils are equal, round, and reactive to light.  Cardiovascular:     Rate and Rhythm: Normal rate and regular rhythm.     Pulses:          Radial pulses are 2+ on the right side and 2+ on the left side.     Heart sounds: Normal heart sounds. No murmur heard.   Pulmonary:     Effort: Pulmonary effort is normal. No respiratory distress.     Breath  sounds: Normal breath sounds. No decreased breath sounds or wheezing.  Abdominal:     Palpations: Abdomen is soft.     Tenderness: There is no abdominal tenderness.  Musculoskeletal:        General: Normal range of motion.     Cervical back: Normal range of motion and neck supple.     Right lower leg: No edema.     Left lower leg: No edema.  Skin:    General: Skin is warm and dry.     Capillary Refill: Capillary refill takes less than 2 seconds.  Neurological:     General: No focal deficit present.     Mental Status: She is alert.     ED Results / Procedures / Treatments   Labs (all labs ordered are listed, but only abnormal results are displayed) Labs Reviewed  BASIC METABOLIC PANEL - Abnormal; Notable for the following components:      Result Value   Potassium 3.4 (*)    Creatinine, Ser 1.21 (*)    GFR, Estimated 53 (*)    All other components within normal limits  CBC WITH DIFFERENTIAL/PLATELET  TROPONIN I (HIGH SENSITIVITY)    EKG None  Radiology DG Chest Portable 1 View  Result Date: 07/27/2020 CLINICAL DATA:  Chest pain. EXAM: PORTABLE CHEST 1 VIEW COMPARISON:  No prior. FINDINGS: Mediastinum and hilar structures normal. Heart size normal. No focal infiltrate. No pleural effusion or pneumothorax. Degenerative change thoracic spine. IMPRESSION: No acute cardiopulmonary disease. Electronically Signed   By: Maisie Fus  Register   On: 07/27/2020 12:09    Procedures Procedures   Medications Ordered in ED Medications  labetalol (NORMODYNE) injection 10 mg (10 mg Intravenous Given 07/27/20 1230)  alum & mag hydroxide-simeth (MAALOX/MYLANTA) 200-200-20 MG/5ML suspension 30 mL (30 mLs Oral Given 07/27/20 1230)    ED Course  I have reviewed the triage vital signs and the nursing notes.  Pertinent labs & imaging results that were available during my care of the patient were reviewed by me and considered in my medical decision making (see chart for details).    MDM  Rules/Calculators/A&P                          Bailey Ward is here with chest pain.  History of high blood pressure.  Blood pressure elevated at 212/115.  Has missed the last several days of her blood pressure medicine.  Took a dose this morning.  We will give her dose of labetalol.  Having burning chest pain.  Worse after eating hamburger and onions last night.  She has clear breath sounds.  No PE risk factors.  EKG shows sinus  rhythm.  Overall suspect GI process but given elevated blood pressure will check troponin and basic labs.  Chest x-ray showed no signs of pneumonia, pneumothorax, pleural effusion.  We will give GI cocktail and reevaluate.  Overall lab work is unremarkable.  No significant anemia, electrolyte abnormality.  Creatinine 1.2.  Troponin 4.  EKG reassuring.  Heart score is 2 but overall atypical story. chest x-ray without any signs of infection.  Blood pressure is slightly improved.  Recommend that she continue her blood pressure medication.  Doubt ACS given that she has had this discomfort over almost 18 hours.  Reassuring EKG and troponin.  GI cocktail with some help.  Suspect possibly reflux related pain versus muscular process.  Understands return precautions and discharged in ED in good condition.  This chart was dictated using voice recognition software.  Despite best efforts to proofread,  errors can occur which can change the documentation meaning.    Final Clinical Impression(s) / ED Diagnoses Final diagnoses:  Nonspecific chest pain    Rx / DC Orders ED Discharge Orders         Ordered    omeprazole (PRILOSEC OTC) 20 MG tablet  Daily        07/27/20 1348           Virgina Norfolk, DO 07/27/20 1350    Baruc Tugwell, DO 07/27/20 1350

## 2020-07-27 NOTE — ED Triage Notes (Signed)
Pt started having indigestion yesterday after eating lasagna.  Pt states she has dull pain in central chest.  Pt took an alkaseltzer and it improved.  Last night she ate a hamburger with onions and the pain returned.  Pt states the sensation has improved some.  No SOB, No diaphoresis, no N/V.  Pt went to UC and was sent here for evaluation since her BP was elevated.  Pt relates she has been off her medications for a few days.

## 2020-07-27 NOTE — ED Provider Notes (Signed)
[facility]    CSN: [id number] Arrival date & time: 07/27/20  1048      History   Chief Complaint Chief Complaint  Patient presents with  . Chest Pain    HPI Bailey Ward is a 55 y.o. female history of hypertension presenting today for evaluation of chest pain.  Reports dull ache in central chest beginning yesterday.  Denies any new shortness of breath.  Denies association with exertion, position or eating.  Initially thought symptoms related to indigestion and took some Tums, symptoms did slightly improve, but symptoms have been persistent and constant into today.  Denies any recent increase in activity or heavy lifting.  Denies recent cough, URI symptoms or fevers.  Denies associated shortness of breath.  Denies nausea vomiting or abdominal pain.  Denies any dizziness or lightheadedness.  Denies any jaw pain or arm pain.  Denies any leg pain or leg swelling.  Denies history of any other cardiac problems besides hypertension.  Denies diabetes history.  Denies tobacco use/smoking.  Denies death from early age from MI in family.  Did miss approximately 3 days of losartan recently, was then back on medicine x2 days.  Took losartan this morning. HPI  Past Medical History:  Diagnosis Date  . Allergy   . Hypertension     Patient Active Problem List   Diagnosis Date Noted  . S/P hysterectomy 05/02/2020  . Idiopathic chronic venous hypertension of both lower extremities with inflammation 07/28/2017  . Chronic pain of left knee 12/04/2016  . Vitamin D deficiency 08/02/2016  . Encounter for general adult medical examination with abnormal findings 08/01/2015  . Obesity 08/01/2015  . Hair loss 09/08/2014  . Hypertensive disorder 06/24/2014    Past Surgical History:  Procedure Laterality Date  . ABDOMINAL HYSTERECTOMY      OB History   No obstetric history on file.      Home Medications    Prior to Admission medications   Medication Sig Start Date End  Date Taking? Authorizing Provider  cetirizine (ZYRTEC) 10 MG tablet Take 10 mg by mouth daily as needed for allergies (only in allergy season).   Yes [provider]  losartan-hydrochlorothiazide (HYZAAR) 100-25 MG tablet TAKE 1 TABLET BY MOUTH EVERY DAY 09/07/19  Yes Cirigliano, Jearld Lesch, DO    Family History Family History  Problem Relation Age of Onset  . Hypertension Mother   . Diabetes Father   . Hypertension Father     Social History Social History   Tobacco Use  . Smoking status: Never Smoker  . Smokeless tobacco: Never Used  Vaping Use  . Vaping Use: Never used  Substance Use Topics  . Alcohol use: No  . Drug use: No     Allergies   Patient has no known allergies.   Review of Systems Review of Systems  Constitutional: Negative for fatigue and fever.  HENT: Negative for congestion, sinus pressure and sore throat.   Eyes: Negative for photophobia, pain and visual disturbance.  Respiratory: Negative for cough and shortness of breath.   Cardiovascular: Positive for chest pain.  Gastrointestinal: Negative for abdominal pain, nausea and vomiting.  Genitourinary: Negative for decreased urine volume and hematuria.  Musculoskeletal: Negative for myalgias, neck pain and neck stiffness.  Neurological: Negative for dizziness, syncope, facial asymmetry, speech difficulty, weakness, light-headedness, numbness and headaches.     Physical Exam Triage Vital Signs ED Triage Vitals  Enc Vitals Group     BP      Pulse  Resp      Temp      Temp src      SpO2      Weight      Height      Head Circumference      Peak Flow      Pain Score      Pain Loc      Pain Edu?      Excl. in GC?    No data found.  Updated Vital Signs BP (!) 192/99 Comment: Less Woolsey, PA notified  Pulse 77   Temp 98.4 F (36.9 C)   Resp 18   SpO2 96%   Visual Acuity Right Eye Distance:   Left Eye Distance:   Bilateral Distance:    Right Eye Near:   Left Eye Near:     Bilateral Near:     Physical Exam Vitals and nursing note reviewed.  Constitutional:      Appearance: She is well-developed.     Comments: No acute distress  HENT:     Head: Normocephalic and atraumatic.     Nose: Nose normal.  Eyes:     Conjunctiva/sclera: Conjunctivae normal.  Cardiovascular:     Rate and Rhythm: Normal rate and regular rhythm.  Pulmonary:     Effort: Pulmonary effort is normal. No respiratory distress.     Comments: Breathing comfortably at rest, CTABL, no wheezing, rales or other adventitious sounds auscultated  No reproducible chest tenderness Abdominal:     General: There is no distension.  Musculoskeletal:        General: Normal range of motion.     Cervical back: Neck supple.     Comments: Bilateral lower legs symmetric without calf swelling or tenderness  Skin:    General: Skin is warm and dry.  Neurological:     Mental Status: She is alert and oriented to person, place, and time.      UC Treatments / Results  Labs (all labs ordered are listed, but only abnormal results are displayed) Labs Reviewed - No data to display  EKG   Radiology No results found.  Procedures Procedures (including critical care time)  Medications Ordered in UC Medications - No data to display  Initial Impression / Assessment and Plan / UC Course  I have reviewed the triage vital signs and the nursing notes.  Pertinent labs & imaging results that were available during my care of the patient were reviewed by me and considered in my medical decision making (see chart for details).  Clinical Course as of 07/27/20 1123  Thu Jul 27, 2020  1103 EKG normal sinus rhythm, no acute signs of ischemia or infarction [HW]  1118 185/118 [HW]    Clinical Course User Index [HW] Lew Dawes, PA-C    55 year old female with elevated blood pressure, first recheck after triage 199/123 right arm sitting, second recheck left arm sitting 185/118.  Given this with  associated chest pain recommending further work-up and evaluation in emergency room to further rule out cardiac etiology with troponins.  EKG without any signs of STEMI/NSTEMI.  Patient and husband verbalized understanding and expressed intent to follow-up in emergency room.  Discussed strict return precautions. Patient verbalized understanding and is agreeable with plan.  Final Clinical Impressions(s) / UC Diagnoses   Final diagnoses:  Chest pain, unspecified type  Hypertensive urgency   Discharge Instructions   None    ED Prescriptions    None     PDMP not reviewed this encounter.  Lew Dawes, [place] 07/27/20 1123

## 2020-07-27 NOTE — ED Notes (Signed)
Prior to AVS being reviewed with client, MD aware that last NBP was >263mmHg, order rec to medicate with 5mg  Norvasc prior to DC home and also provided Rx for same. Pt teaching done in regards to both Rx by ED Provider. Also discussed and recommended client begin a BP diary for her primary MD and to watch her diet for sodium intake. Opportunity for questions provided as well prior to DC to home

## 2020-07-27 NOTE — ED Notes (Signed)
Patient is being discharged from the Urgent Care and sent to the Emergency Department via POV. Per [facility], patient is in need of higher level of care due to active chest pain. Patient is aware and verbalizes understanding of plan of care.  Vitals:   07/27/20 1102  BP: (!) 192/99  Pulse: 77  Resp: 18  Temp: 98.4 F (36.9 C)  SpO2: 96%

## 2020-08-02 ENCOUNTER — Ambulatory Visit: Payer: Federal, State, Local not specified - PPO | Admitting: Family Medicine

## 2020-08-02 ENCOUNTER — Other Ambulatory Visit: Payer: Self-pay

## 2020-08-02 ENCOUNTER — Emergency Department (HOSPITAL_BASED_OUTPATIENT_CLINIC_OR_DEPARTMENT_OTHER)
Admission: EM | Admit: 2020-08-02 | Discharge: 2020-08-02 | Disposition: A | Discharge status: Home / Self Care | Payer: Federal, State, Local not specified - PPO | Attending: Emergency Medicine | Admitting: Emergency Medicine

## 2020-08-02 ENCOUNTER — Encounter (HOSPITAL_BASED_OUTPATIENT_CLINIC_OR_DEPARTMENT_OTHER): Payer: Self-pay | Admitting: Emergency Medicine

## 2020-08-02 ENCOUNTER — Emergency Department (HOSPITAL_BASED_OUTPATIENT_CLINIC_OR_DEPARTMENT_OTHER): Payer: Federal, State, Local not specified - PPO

## 2020-08-02 DIAGNOSIS — Z79899 Other long term (current) drug therapy: Secondary | ICD-10-CM | POA: Insufficient documentation

## 2020-08-02 DIAGNOSIS — R10A2 Flank pain, left side: Secondary | ICD-10-CM

## 2020-08-02 DIAGNOSIS — R109 Unspecified abdominal pain: Secondary | ICD-10-CM | POA: Diagnosis not present

## 2020-08-02 DIAGNOSIS — R0789 Other chest pain: Secondary | ICD-10-CM | POA: Diagnosis not present

## 2020-08-02 DIAGNOSIS — I10 Essential (primary) hypertension: Secondary | ICD-10-CM | POA: Diagnosis not present

## 2020-08-02 DIAGNOSIS — R079 Chest pain, unspecified: Secondary | ICD-10-CM | POA: Diagnosis not present

## 2020-08-02 LAB — URINALYSIS, ROUTINE W REFLEX MICROSCOPIC
Bilirubin Urine: NEGATIVE
Glucose, UA: NEGATIVE mg/dL
Hgb urine dipstick: NEGATIVE
Ketones, ur: NEGATIVE mg/dL
Nitrite: NEGATIVE
Protein, ur: NEGATIVE mg/dL
Specific Gravity, Urine: 1.015 (ref 1.005–1.030)
pH: 6.5 (ref 5.0–8.0)

## 2020-08-02 LAB — URINALYSIS, MICROSCOPIC (REFLEX)

## 2020-08-02 LAB — CBC WITH DIFFERENTIAL/PLATELET
Abs Immature Granulocytes: 0.02 10*3/uL (ref 0.00–0.07)
Basophils Absolute: 0 10*3/uL (ref 0.0–0.1)
Basophils Relative: 0 %
Eosinophils Absolute: 0.3 10*3/uL (ref 0.0–0.5)
Eosinophils Relative: 4 %
HCT: 40.8 % (ref 36.0–46.0)
Hemoglobin: 13.2 g/dL (ref 12.0–15.0)
Immature Granulocytes: 0 %
Lymphocytes Relative: 42 %
Lymphs Abs: 3.1 10*3/uL (ref 0.7–4.0)
MCH: 27.4 pg (ref 26.0–34.0)
MCHC: 32.4 g/dL (ref 30.0–36.0)
MCV: 84.6 fL (ref 80.0–100.0)
Monocytes Absolute: 0.5 10*3/uL (ref 0.1–1.0)
Monocytes Relative: 7 %
Neutro Abs: 3.4 10*3/uL (ref 1.7–7.7)
Neutrophils Relative %: 47 %
Platelets: 350 10*3/uL (ref 150–400)
RBC: 4.82 MIL/uL (ref 3.87–5.11)
RDW: 14 % (ref 11.5–15.5)
Smear Review: ADEQUATE
WBC: 7.3 10*3/uL (ref 4.0–10.5)
nRBC: 0 % (ref 0.0–0.2)

## 2020-08-02 LAB — BASIC METABOLIC PANEL
Anion gap: 13 (ref 5–15)
BUN: 21 mg/dL — ABNORMAL HIGH (ref 6–20)
CO2: 26 mmol/L (ref 22–32)
Calcium: 9.7 mg/dL (ref 8.9–10.3)
Chloride: 97 mmol/L — ABNORMAL LOW (ref 98–111)
Creatinine, Ser: 1.35 mg/dL — ABNORMAL HIGH (ref 0.44–1.00)
GFR, Estimated: 47 mL/min — ABNORMAL LOW (ref >60)
Glucose, Bld: 115 mg/dL — ABNORMAL HIGH (ref 70–99)
Potassium: 3.2 mmol/L — ABNORMAL LOW (ref 3.5–5.1)
Sodium: 136 mmol/L (ref 135–145)

## 2020-08-02 LAB — TROPONIN I (HIGH SENSITIVITY)
Troponin I (High Sensitivity): 4 ng/L (ref <18)
Troponin I (High Sensitivity): 5 ng/L (ref <18)

## 2020-08-02 MED ORDER — POTASSIUM CHLORIDE CRYS ER 20 MEQ PO TBCR
40.0000 meq | EXTENDED_RELEASE_TABLET | Freq: Once | ORAL | Status: AC
  Administered 2020-08-02: 40 meq via ORAL
  Filled 2020-08-02: qty 2

## 2020-08-02 NOTE — ED Triage Notes (Signed)
Pt reports recurrent central chest pain that radiates down to her left lower back for the past two days; started again this afternoon; denies [facility] or N/V

## 2020-08-02 NOTE — ED Notes (Signed)
Patient transported to X-ray 

## 2020-08-02 NOTE — ED Provider Notes (Signed)
[facility] Provider Note: Lowella Dell, MD, FACEP  CSN: [id number] MRN: [id number] ARRIVAL: 08/02/20 at 0003 ROOM: [facility]   CHIEF COMPLAINT  Chest Pain   HISTORY OF PRESENT ILLNESS  08/02/20 12:53 AM Bailey Ward is a 55 y.o. female with chest pain that began yesterday evening.  She has a discomfort in her upper mid chest which she states is not exactly a pain but more of a pressure.  This discomfort is mild.  Her principal pain is located in her left flank at about the CVA.  She rates this pain as moderate at its worst but nearly abated at this time.  It is slightly worse with palpation but none of her pain or discomfort is worse with movement or deep breathing.  She is not short of breath.  She has had no nausea or vomiting.  She denies any hematuria or dysuria.  She has had similar pain occasionally over the last week.   Past Medical History:  Diagnosis Date  . Allergy   . Hypertension     Past Surgical History:  Procedure Laterality Date  . ABDOMINAL HYSTERECTOMY      Family History  Problem Relation Age of Onset  . Hypertension Mother   . Diabetes Father   . Hypertension Father     Social History   Tobacco Use  . Smoking status: Never Smoker  . Smokeless tobacco: Never Used  Vaping Use  . Vaping Use: Never used  Substance Use Topics  . Alcohol use: No  . Drug use: No    Prior to Admission medications   Medication Sig Start Date End Date Taking? Authorizing Provider  amLODipine (NORVASC) 5 MG tablet Take 1 tablet (5 mg total) by mouth daily. 07/27/20 08/26/20  Curatolo, Adam, DO  cetirizine (ZYRTEC) 10 MG tablet Take 10 mg by mouth daily as needed for allergies (only in allergy season).    [provider]  losartan-hydrochlorothiazide (HYZAAR) 100-25 MG tablet TAKE 1 TABLET BY MOUTH EVERY DAY 09/07/19   Cirigliano, Jearld Lesch, DO  omeprazole (PRILOSEC OTC) 20 MG tablet Take 1 tablet (20 mg total) by mouth daily for 14 days. 07/27/20  08/10/20  Virgina Norfolk, DO    Allergies Patient has no known allergies.   REVIEW OF SYSTEMS  Negative except as noted here or in the History of Present Illness.   PHYSICAL EXAMINATION  Initial Vital Signs Blood pressure (!) 160/83, pulse 79, temperature 99.1 F (37.3 C), temperature source Oral, resp. rate 20, height 5\' 3"  (1.6 m), weight 93.9 kg, SpO2 97 %.  Examination General: Well-developed, well-nourished female in no acute distress; appearance consistent with age of record HENT: normocephalic; atraumatic Eyes: pupils equal, round and reactive to light; extraocular muscles intact Neck: supple Heart: regular rate and rhythm; no murmur Lungs: clear to auscultation bilaterally Chest: Mild left CVA tenderness Abdomen: soft; nondistended; mild left upper quadrant tenderness; no masses or hepatosplenomegaly; bowel sounds present Extremities: No deformity; full range of motion; pulses normal Neurologic: Awake, alert and oriented; motor function intact in all extremities and symmetric; no facial droop Skin: Warm and dry Psychiatric: Normal mood and affect   RESULTS  Summary of this visit's results, reviewed and interpreted by myself:   EKG Interpretation  Date/Time:    Ventricular Rate:    PR Interval:    QRS Duration:   QT Interval:    QTC Calculation:   R Axis:     Text Interpretation:  Laboratory Studies: Results for orders placed or performed during the hospital encounter of 08/02/20 (from the past 24 hour(s))  Basic metabolic panel     Status: Abnormal   Collection Time: 08/02/20  1:33 AM  Result Value Ref Range   Sodium 136 135 - 145 mmol/L   Potassium 3.2 (L) 3.5 - 5.1 mmol/L   Chloride 97 (L) 98 - 111 mmol/L   CO2 26 22 - 32 mmol/L   Glucose, Bld 115 (H) 70 - 99 mg/dL   BUN 21 (H) 6 - 20 mg/dL   Creatinine, Ser 0.76 (H) 0.44 - 1.00 mg/dL   Calcium 9.7 8.9 - 22.6 mg/dL   GFR, Estimated 47 (L) >60 mL/min   Anion gap 13 5 - 15  CBC with  Differential     Status: None   Collection Time: 08/02/20  1:33 AM  Result Value Ref Range   WBC 7.3 4.0 - 10.5 K/uL   RBC 4.82 3.87 - 5.11 MIL/uL   Hemoglobin 13.2 12.0 - 15.0 g/dL   HCT 33.3 54.5 - 62.5 %   MCV 84.6 80.0 - 100.0 fL   MCH 27.4 26.0 - 34.0 pg   MCHC 32.4 30.0 - 36.0 g/dL   RDW 63.8 93.7 - 34.2 %   Platelets 350 150 - 400 K/uL   nRBC 0.0 0.0 - 0.2 %   Neutrophils Relative % 47 %   Neutro Abs 3.4 1.7 - 7.7 K/uL   Lymphocytes Relative 42 %   Lymphs Abs 3.1 0.7 - 4.0 K/uL   Monocytes Relative 7 %   Monocytes Absolute 0.5 0.1 - 1.0 K/uL   Eosinophils Relative 4 %   Eosinophils Absolute 0.3 0.0 - 0.5 K/uL   Basophils Relative 0 %   Basophils Absolute 0.0 0.0 - 0.1 K/uL   WBC Morphology MORPHOLOGY UNREMARKABLE    RBC Morphology MORPHOLOGY UNREMARKABLE    Smear Review PLATELETS APPEAR ADEQUATE    Immature Granulocytes 0 %   Abs Immature Granulocytes 0.02 0.00 - 0.07 K/uL  Troponin I (High Sensitivity)     Status: None   Collection Time: 08/02/20  1:34 AM  Result Value Ref Range   Troponin I (High Sensitivity) 4 <18 ng/L  Urinalysis, Routine w reflex microscopic Urine, Clean Catch     Status: Abnormal   Collection Time: 08/02/20  2:15 AM  Result Value Ref Range   Color, Urine YELLOW YELLOW   APPearance HAZY (A) CLEAR   Specific Gravity, Urine 1.015 1.005 - 1.030   pH 6.5 5.0 - 8.0   Glucose, UA NEGATIVE NEGATIVE mg/dL   Hgb urine dipstick NEGATIVE NEGATIVE   Bilirubin Urine NEGATIVE NEGATIVE   Ketones, ur NEGATIVE NEGATIVE mg/dL   Protein, ur NEGATIVE NEGATIVE mg/dL   Nitrite NEGATIVE NEGATIVE   Leukocytes,Ua SMALL (A) NEGATIVE  Urinalysis, Microscopic (reflex)     Status: Abnormal   Collection Time: 08/02/20  2:15 AM  Result Value Ref Range   RBC / HPF 0-5 0 - 5 RBC/hpf   WBC, UA 6-10 0 - 5 WBC/hpf   Bacteria, UA RARE (A) NONE SEEN   Squamous Epithelial / LPF 6-10 0 - 5  Troponin I (High Sensitivity)     Status: None   Collection Time: 08/02/20  2:40 AM   Result Value Ref Range   Troponin I (High Sensitivity) 5 <18 ng/L   Imaging Studies: DG Chest 2 View  Result Date: 08/02/2020 CLINICAL DATA:  Recurrent central chest pain radiating to the low back for  2 days. EXAM: CHEST - 2 VIEW COMPARISON:  07/27/2020 FINDINGS: The heart size and mediastinal contours are within normal limits. Both lungs are clear. The visualized skeletal structures are unremarkable. IMPRESSION: No active cardiopulmonary disease. Electronically Signed   By: Burman Nieves M.D.   On: 08/02/2020 01:34    ED COURSE and MDM  Nursing notes, initial and subsequent vitals signs, including pulse oximetry, reviewed and interpreted by myself.  Vitals:   08/02/20 0126 08/02/20 0130 08/02/20 0200 08/02/20 0300  BP: (!) 141/71 139/76 125/69 116/87  Pulse: 74 74 72 70  Resp: 20 16 14 15   Temp:      TempSrc:      SpO2: 100% 97% 97% 96%  Weight:      Height:       Medications  potassium chloride SA (KLOR-CON) CR tablet 40 mEq (40 mEq Oral Given 08/02/20 0221)   3:23 AM Pain-free.  2 troponins are negative and her EKG is unremarkable.  The patient's pain is primarily located in the left flank.  Her urine is not showing blood to suggest kidney stone and her degree of pain is not consistent with a kidney stone.  Pyelonephritis is also not supported by the trace pyuria seen in her urinalysis and she is having no other signs or symptoms of pyelonephritis.  I suspect her pain is musculoskeletal but she was advised to return if symptoms worsen or change.   PROCEDURES  Procedures   ED DIAGNOSES     ICD-10-CM   1. Atypical chest pain  R07.89   2. Left flank pain  R10.9        Tomma Ehinger, 08/04/20, MD 08/02/20 [phone]

## 2020-08-07 NOTE — Patient Instructions (Signed)
Health Maintenance Due  Topic Date Due  . COVID-19 Vaccine (1) Never done  . COLONOSCOPY (Pts 45-82yrs Insurance coverage will need to be confirmed)  Never done  . PAP SMEAR-Modifier  03/22/2018  . MAMMOGRAM  05/24/2018    Depression screen [facility] 2/9 05/02/2020 05/06/2018 08/01/2015  Decreased Interest 0 0 0  Down, Depressed, Hopeless 0 0 0  PHQ - 2 Score 0 0 0

## 2020-08-10 ENCOUNTER — Encounter: Payer: Self-pay | Admitting: Family Medicine

## 2020-08-10 ENCOUNTER — Other Ambulatory Visit: Payer: Self-pay

## 2020-08-10 ENCOUNTER — Ambulatory Visit: Payer: Federal, State, Local not specified - PPO | Admitting: Family Medicine

## 2020-08-10 VITALS — BP 120/98 | HR 83 | Temp 98.4°F | Ht 63.0 in | Wt 208.4 lb

## 2020-08-10 DIAGNOSIS — I10 Essential (primary) hypertension: Secondary | ICD-10-CM

## 2020-08-10 DIAGNOSIS — E876 Hypokalemia: Secondary | ICD-10-CM

## 2020-08-10 DIAGNOSIS — Z1211 Encounter for screening for malignant neoplasm of colon: Secondary | ICD-10-CM | POA: Diagnosis not present

## 2020-08-10 MED ORDER — AMLODIPINE BESYLATE 5 MG PO TABS: 5.0000 mg | ORAL_TABLET | Freq: Every day | ORAL | 3 refills | Status: DC

## 2020-08-10 MED ORDER — LOSARTAN POTASSIUM-HCTZ 100-25 MG PO TABS: 1.0000 | ORAL_TABLET | Freq: Every day | ORAL | 3 refills | Status: DC

## 2020-08-10 NOTE — Progress Notes (Signed)
Chief Complaint  Patient presents with  . Follow-up    3 month follow up.      HPI: Bailey Ward is a 55 y.o. female here for HTN follow-up. Pt is taking norvasc 5mg  daily and hyzaar 100-25mg  daily. She was seen in the ER in 07/2020 and amlodipine 5mg  daily was started at that time.  She does check BP at home - SBP 117-120/ DBP 89-93.   BP Readings from Last 3 Encounters:  08/10/20 (!) 120/98  08/02/20 116/87  07/27/20 (!) 208/100   Lab Results  Component Value Date   CREATININE 1.35 (H) 08/02/2020   BUN 21 (H) 08/02/2020   NA 136 08/02/2020   K 3.2 (L) 08/02/2020   CL 97 (L) 08/02/2020   CO2 26 08/02/2020   Diet: made some dietary changes - does not add salt or seasonings with salt and looks at sodium content on food labels She has lost 12lbs since 04/2020   Past Medical History:  Diagnosis Date  . Allergy   . Hypertension     Past Surgical History:  Procedure Laterality Date  . ABDOMINAL HYSTERECTOMY      Social History   Socioeconomic History  . Marital status: Married    Spouse name: Not on file  . Number of children: 2  . Years of education: 48  . Highest education level: Not on file  Occupational History  . Occupation: 05/2020  Tobacco Use  . Smoking status: Never Smoker  . Smokeless tobacco: Never Used  Vaping Use  . Vaping Use: Never used  Substance and Sexual Activity  . Alcohol use: No  . Drug use: No  . Sexual activity: Not on file  Other Topics Concern  . 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 Fun: Travel, go to [place], watch her daughter play soccer, watch movies   Denies religious beliefs that would effect health care.    Denies abuse and feels safe at home.    Social Determinants of Health   Financial Resource Strain: Not on file  Food Insecurity: Not on file  Transportation Needs: Not on file  Physical Activity: Not on file  Stress: Not on file  Social Connections:  Not on file  Intimate Partner Violence: Not on file    Family History  Problem Relation Age of Onset  . Hypertension Mother   . Diabetes Father   . Hypertension Father       There is no immunization history on file for this patient.  Outpatient Encounter Medications as of 08/10/2020  Medication Sig  . amLODipine (NORVASC) 5 MG tablet Take 1 tablet (5 mg total) by mouth daily.  . cetirizine (ZYRTEC) 10 MG tablet Take 10 mg by mouth daily as needed for allergies (only in allergy season).  . [DISCONTINUED] amLODipine (NORVASC) 5 MG tablet Take 1 tablet (5 mg total) by mouth daily.  . [DISCONTINUED] losartan-hydrochlorothiazide (HYZAAR) 100-25 MG tablet TAKE 1 TABLET BY MOUTH EVERY DAY  . losartan-hydrochlorothiazide (HYZAAR) 100-25 MG tablet Take 1 tablet by mouth daily.  . [DISCONTINUED] omeprazole (PRILOSEC OTC) 20 MG tablet Take 1 tablet (20 mg total) by mouth daily for 14 days. (Patient not taking: Reported on 08/10/2020)   No facility-administered encounter medications on file as of 08/10/2020.     ROS: Pertinent positives and negatives noted in HPI. Remainder of ROS non-contributory  No Known Allergies  BP (!) 120/98 (BP Location: Left  Arm, Patient Position: Sitting, Cuff Size: Normal)   Pulse 83   Temp 98.4 F (36.9 C) (Oral)   Ht 5\' 3"  (1.6 m)   Wt 208 lb 6.4 oz (94.5 kg)   SpO2 97%   BMI 36.92 kg/m   Wt Readings from Last 3 Encounters:  08/10/20 208 lb 6.4 oz (94.5 kg)  08/02/20 207 lb (93.9 kg)  07/27/20 213 lb 8 oz (96.8 kg)   Temp Readings from Last 3 Encounters:  08/10/20 98.4 F (36.9 C) (Oral)  08/02/20 98.8 F (37.1 C) (Oral)  07/27/20 98.3 F (36.8 C)   BP Readings from Last 3 Encounters:  08/10/20 (!) 120/98  08/02/20 116/87  07/27/20 (!) 208/100   Pulse Readings from Last 3 Encounters:  08/10/20 83  08/02/20 70  07/27/20 70     Physical Exam Constitutional:      General: She is not in acute distress.    Appearance: She is not  ill-appearing.  Cardiovascular:     Rate and Rhythm: Normal rate and regular rhythm.     Pulses: Normal pulses.     Heart sounds: Normal heart sounds.  Pulmonary:     Effort: Pulmonary effort is normal. No respiratory distress.     Breath sounds: Normal breath sounds. No wheezing or rhonchi.  Musculoskeletal:     Right lower leg: Edema present.     Left lower leg: Edema (trace B/L pedal edema) present.  Neurological:     Mental Status: She is alert and oriented to person, place, and time.  Psychiatric:        Behavior: Behavior normal.      A/P:  1. Essential hypertension - DBP is borderline, SBP controlled. Will cont current meds at this time and pt to check BP at home and call if DBP is consistently above 90 - Basic metabolic panel Refill: - losartan-hydrochlorothiazide (HYZAAR) 100-25 MG tablet; Take 1 tablet by mouth daily.  Dispense: 90 tablet; Refill: 3 - amLODipine (NORVASC) 5 MG tablet; Take 1 tablet (5 mg total) by mouth daily.  Dispense: 90 tablet; Refill: 3 - pt has lost 12lbs since 04/2020 and is working to increase CV exercise. Congratulated her on this! She has also cut back on sodium but does endorse needing to reduce the amount of restaurant food she eats  2. Hypokalemia - Basic metabolic panel  3. Colon cancer screening - Ambulatory referral to Gastroenterology   This visit occurred during the SARS-CoV-2 public health emergency.  Safety protocols were in place, including screening questions prior to the visit, additional usage of staff PPE, and extensive cleaning of exam room while observing appropriate contact time as indicated for disinfecting solutions.

## 2020-08-11 LAB — BASIC METABOLIC PANEL
BUN: 19 mg/dL (ref 6–23)
CO2: 31 mEq/L (ref 19–32)
Calcium: 10.1 mg/dL (ref 8.4–10.5)
Chloride: 98 mEq/L (ref 96–112)
Creatinine, Ser: 1.29 mg/dL — ABNORMAL HIGH (ref 0.40–1.20)
GFR: 47.03 mL/min — ABNORMAL LOW (ref >60.00)
Glucose, Bld: 105 mg/dL — ABNORMAL HIGH (ref 70–99)
Potassium: 3.8 mEq/L (ref 3.5–5.1)
Sodium: 139 mEq/L (ref 135–145)

## 2021-04-06 ENCOUNTER — Ambulatory Visit
Admission: EM | Admit: 2021-04-06 | Discharge: 2021-04-06 | Disposition: A | Discharge status: Home / Self Care | Payer: Federal, State, Local not specified - PPO | Attending: Emergency Medicine | Admitting: Emergency Medicine

## 2021-04-06 ENCOUNTER — Other Ambulatory Visit: Payer: Self-pay

## 2021-04-06 ENCOUNTER — Encounter: Payer: Self-pay | Admitting: Emergency Medicine

## 2021-04-06 DIAGNOSIS — J22 Unspecified acute lower respiratory infection: Secondary | ICD-10-CM

## 2021-04-06 DIAGNOSIS — J329 Chronic sinusitis, unspecified: Secondary | ICD-10-CM

## 2021-04-06 DIAGNOSIS — J310 Chronic rhinitis: Secondary | ICD-10-CM

## 2021-04-06 DIAGNOSIS — J029 Acute pharyngitis, unspecified: Secondary | ICD-10-CM

## 2021-04-06 MED ORDER — IPRATROPIUM BROMIDE 0.06 % NA SOLN: 2.0000 | Freq: Four times a day (QID) | NASAL | 0 refills | Status: DC

## 2021-04-06 MED ORDER — AZITHROMYCIN 250 MG PO TABS: ORAL_TABLET | ORAL | 0 refills | Status: AC

## 2021-04-06 MED ORDER — AEROCHAMBER PLUS FLO-VU LARGE MISC: 1.0000 | Freq: Once | 0 refills | Status: AC

## 2021-04-06 MED ORDER — GUAIFENESIN 400 MG PO TABS: ORAL_TABLET | ORAL | 0 refills | Status: DC

## 2021-04-06 MED ORDER — FAMOTIDINE 20 MG PO TABS: 20.0000 mg | ORAL_TABLET | Freq: Two times a day (BID) | ORAL | 0 refills | Status: DC

## 2021-04-06 MED ORDER — METHYLPREDNISOLONE 4 MG PO TBPK: ORAL_TABLET | ORAL | 0 refills | Status: DC

## 2021-04-06 MED ORDER — ALBUTEROL SULFATE HFA 108 (90 BASE) MCG/ACT IN AERS: 2.0000 | INHALATION_SPRAY | Freq: Four times a day (QID) | RESPIRATORY_TRACT | 0 refills | Status: DC | PRN

## 2021-04-06 MED ORDER — LIDOCAINE VISCOUS HCL 2 % MT SOLN: 15.0000 mL | OROMUCOSAL | 0 refills | Status: DC | PRN

## 2021-04-06 MED ORDER — PROMETHAZINE-DM 6.25-15 MG/5ML PO SYRP: 5.0000 mL | ORAL_SOLUTION | Freq: Four times a day (QID) | ORAL | 0 refills | Status: DC | PRN

## 2021-04-06 NOTE — Discharge Instructions (Signed)
Your symptoms and physical exam findings are concerning for a viral respiratory infection that has become complicated with the presence of bacteria.  I provided you with a prescription for azithromycin, please take 2 tablets on day 1 and 1 tablet every day thereafter for coverage of possible early community-acquired pneumonia.   Conservative care is also recommended at this time.  This includes rest, pushing clear fluids and activity as tolerated.  Warm beverages such as teas and broths versus cold beverages/popsicles and frozen sherbet/sorbet are personal choice, both warm and cold are beneficial.  You may also notice that your appetite is reduced; this is okay as long as you are drinking plenty of clear fluids.    Please see the list below for recommended medications, dosages and frequencies to provide relief of your current symptoms:     Viscous lidocaine (Xylocaine): This is a very effective throat pain reliever, swish 15 mL for 10 to 15 seconds then swallow slowly.  This can be repeated every 3 hours while awake.  I have provided you with a prescription.  Guaifenesin (Robitussin, Mucinex): This is an expectorant.  This helps break up chest congestion and loosen up thick nasal drainage making phlegm and drainage more liquid and therefore easier to remove.  I recommend being 400 mg three times daily as needed.  This has been prescribed for you.   Promethazine DM: Promethazine is both the nasal decongestant and an antinausea medication that makes most patients feel fairly sleepy.  The DM is dextromethorphan, a cough suppressant found many over-the-counter cough medications.  Please take 5 mL before bedtime to help you sleep better, minimize your cough.  I have provided you with a prescription for this medication.      Ipratropium (Atrovent): This is an excellent nasal decongestant spray that does not cause rebound congestion, can be used up to 4 times daily as needed, instill 2 sprays into each nare  with each use.  I have provided you with a prescription for this medication.      Albuterol HFA: This is a bronchodilator, it relaxes the smooth muscles that constrict your airway in your lungs when you are feeling sick or having inflammation secondary to allergies.  Please inhale 2 puffs twice daily every day using the spacer provided.  You can also inhale 2 more puffs as often as needed throughout the day for aggravating cough, chest tightness, feeling short of breath, wheezing.  There would be a prescription for albuterol and a spacer at your pharmacy.   Methylprednisolone (Medrol Dosepak): This is a steroid that will significantly calm your upper and lower airways, please take one row of tablets daily with your breakfast meal starting tomorrow morning until the prescription is complete.     Because I know that the sum total of these medications are definitely going to cause you some stomach upset, I have also provided you with a prescription for famotidine (Pepcid) that you should take twice daily at least while you are in this acute phase to prevent stomach upset and heartburn.  I have provided you with a prescription for this as well.  You can certainly continue Tylenol for fever, if any.  Please avoid ibuprofen while you are taking methylprednisolone.   Please follow-up within the next 3 to 5 days either with your primary care provider or urgent care if your symptoms do not resolve.  If you do not have a primary care provider, we will assist you in finding one.

## 2021-04-06 NOTE — ED Triage Notes (Signed)
Patient presents to [facility] for evaluation thick nasal congestion starting Monday of this week with saore throat and mild cough (mostly clearing her throat).  Denies n/v/d, denies SOB.  +

## 2021-04-06 NOTE — ED Provider Notes (Signed)
[facility]    CSN: [id number] Arrival date & time: 04/06/21  0806    HISTORY   Chief Complaint  Patient presents with   Nasal Congestion   HPI Bailey Ward is a 55 y.o. female. Patient presents to [facility] for evaluation thick nasal congestion starting Monday of this week with saore throat and mild cough (mostly clearing her throat).  Denies n/v/d, denies SOB.  Patient states her husband is getting over a similar illness, he is about 2 to 3 days ahead of her.  States husband had to have steroids.  States she does not know whether husband had flu or COVID or neither.  Patient has tried Mucinex, states this upsets her stomach.  Patient states she has also tried TheraFlu with little relief.  The history is provided by the patient.  Past Medical History:  Diagnosis Date   Allergy    Hypertension    Patient Active Problem List   Diagnosis Date Noted   S/P hysterectomy 05/02/2020   Idiopathic chronic venous hypertension of both lower extremities with inflammation 07/28/2017   Chronic pain of left knee 12/04/2016   Vitamin D deficiency 08/02/2016   Encounter for general adult medical examination with abnormal findings 08/01/2015   Obesity 08/01/2015   Hair loss 09/08/2014   Hypertensive disorder 06/24/2014   Past Surgical History:  Procedure Laterality Date   ABDOMINAL HYSTERECTOMY     OB History   No obstetric history on file.    Home Medications    Prior to Admission medications   Medication Sig Start Date End Date Taking? Authorizing Provider  amLODipine (NORVASC) 5 MG tablet Take 1 tablet (5 mg total) by mouth daily. 08/10/20   Cirigliano, Jearld Lesch, DO  cetirizine (ZYRTEC) 10 MG tablet Take 10 mg by mouth daily as needed for allergies (only in allergy season).    [provider]  losartan-hydrochlorothiazide (HYZAAR) 100-25 MG tablet Take 1 tablet by mouth daily. 08/10/20   CiriglianoJearld Lesch, DO   Family History Family History  Problem Relation  Age of Onset   Hypertension Mother    Diabetes Father    Hypertension Father    Social History Social History   Tobacco Use   Smoking status: Never   Smokeless tobacco: Never  Vaping Use   Vaping Use: Never used  Substance Use Topics   Alcohol use: No   Drug use: No   Allergies   Patient has no known allergies.  Review of Systems Review of Systems Pertinent findings noted in history of present illness.   Physical Exam Triage Vital Signs ED Triage Vitals  Enc Vitals Group     BP 02/02/21 0827 (!) 147/82     Pulse Rate 02/02/21 0827 72     Resp 02/02/21 0827 18     Temp 02/02/21 0827 98.3 F (36.8 C)     Temp Source 02/02/21 0827 Oral     SpO2 02/02/21 0827 98 %     Weight --      Height --      Head Circumference --      Peak Flow --      Pain Score 02/02/21 0826 5     Pain Loc --      Pain Edu? --      Excl. in GC? --   No data found.  Updated Vital Signs BP (!) 143/84 (BP Location: Right Arm)    Pulse 90    Temp 99.2 F (37.3 C) (Oral)  Resp 18    SpO2 97%   Physical Exam Constitutional:      Appearance: She is ill-appearing.  HENT:     Head: Normocephalic and atraumatic.     Salivary Glands: Right salivary gland is not diffusely enlarged or tender. Left salivary gland is not diffusely enlarged or tender.     Right Ear: Tympanic membrane, ear canal and external ear normal.     Left Ear: Tympanic membrane, ear canal and external ear normal.     Nose: Congestion and rhinorrhea present. Rhinorrhea is clear.     Right Sinus: No maxillary sinus tenderness or frontal sinus tenderness.     Left Sinus: No maxillary sinus tenderness.     Mouth/Throat:     Mouth: Mucous membranes are moist.     Pharynx: Pharyngeal swelling, posterior oropharyngeal erythema and uvula swelling present.     Tonsils: No tonsillar exudate. 0 on the right. 0 on the left.  Cardiovascular:     Rate and Rhythm: Normal rate and regular rhythm.     Pulses: Normal pulses.  Pulmonary:      Effort: Pulmonary effort is normal. No accessory muscle usage, prolonged expiration or respiratory distress.     Breath sounds: No stridor. No wheezing, rhonchi or rales.  Abdominal:     General: Abdomen is flat. Bowel sounds are normal.     Palpations: Abdomen is soft.  Musculoskeletal:        General: Normal range of motion.  Lymphadenopathy:     Cervical: No cervical adenopathy.  Skin:    General: Skin is warm and dry.  Neurological:     General: No focal deficit present.     Mental Status: She is alert and oriented to person, place, and time.     Motor: Motor function is intact.     Coordination: Coordination is intact.     Gait: Gait is intact.     Deep Tendon Reflexes: Reflexes are normal and symmetric.  Psychiatric:        Attention and Perception: Attention and perception normal.        Mood and Affect: Mood and affect normal.        Speech: Speech normal.        Behavior: Behavior normal. Behavior is cooperative.        Thought Content: Thought content normal.    Visual Acuity Right Eye Distance:   Left Eye Distance:   Bilateral Distance:    Right Eye Near:   Left Eye Near:    Bilateral Near:     UC Couse / Diagnostics / Procedures:    EKG  Radiology No results found.  Procedures Procedures (including critical care time)  UC Diagnoses / Final Clinical Impressions(s)   I have reviewed the triage vital signs and the nursing notes.  Pertinent labs & imaging results that were available during my care of the patient were reviewed by me and considered in my medical decision making (see chart for details).   Final diagnoses:  Acute respiratory infection  Acute pharyngitis, unspecified etiology  Rhinosinusitis   Given duration of symptoms, patient would not benefit from antiviral treatment therefore viral testing was not performed today.  Patient advised that she may benefit from antibiotics and a short course of steroids.  Patient provided with inhalers  and allergy medications as well.  Return precautions advised.  ED Prescriptions     Medication Sig Dispense Auth. Provider   albuterol (VENTOLIN HFA) 108 (90 Base) MCG/ACT inhaler  Inhale 2 puffs into the lungs every 6 (six) hours as needed for wheezing or shortness of breath (Cough). 18 g Theadora Rama Scales, PA-C   Spacer/Aero-Holding Chambers (AEROCHAMBER PLUS FLO-VU LARGE) MISC 1 each by Other route once for 1 dose. 1 each Theadora Rama Scales, PA-C   guaifenesin (HUMIBID E) 400 MG TABS tablet Take 1 tablet 3 times daily as needed for chest congestion and cough 21 tablet Theadora Rama Scales, PA-C   promethazine-dextromethorphan (PROMETHAZINE-DM) 6.25-15 MG/5ML syrup Take 5 mLs by mouth 4 (four) times daily as needed for cough. 180 mL Theadora Rama Scales, PA-C   methylPREDNISolone (MEDROL DOSEPAK) 4 MG TBPK tablet Take 24 mg on day 1, 20 mg on day 2, 16 mg on day 3, 12 mg on day 4, 8 mg on day 5, 4 mg on day 6. 21 tablet Theadora Rama Scales, PA-C   lidocaine (XYLOCAINE) 2 % solution Use as directed 15 mLs in the mouth or throat every 3 (three) hours as needed for mouth pain (Sore throat). 300 mL Theadora Rama Scales, PA-C   azithromycin (ZITHROMAX) 250 MG tablet Take 2 tablets (500 mg total) by mouth daily for 1 day, THEN 1 tablet (250 mg total) daily for 4 days. 6 tablet Theadora Rama Scales, PA-C   ipratropium (ATROVENT) 0.06 % nasal spray Place 2 sprays into both nostrils 4 (four) times daily. As needed for nasal congestion, runny nose 15 mL Theadora Rama Scales, PA-C   famotidine (PEPCID) 20 MG tablet Take 1 tablet (20 mg total) by mouth 2 (two) times daily. 60 tablet Theadora Rama Scales, PA-C      PDMP not reviewed this encounter.  Pending results:  Labs Reviewed - No data to display  Medications Ordered in UC: Medications - No data to display  Disposition Upon Discharge:  Condition: stable for discharge home Home: take medications as prescribed; routine  discharge instructions as discussed; follow up as advised.  Patient presented with an acute illness with associated systemic symptoms and significant discomfort requiring urgent management. In my opinion, this is a condition that a prudent lay person (someone who possesses an average knowledge of health and medicine) may potentially expect to result in complications if not addressed urgently such as respiratory distress, impairment of bodily function or dysfunction of bodily organs.   Routine symptom specific, illness specific and/or disease specific instructions were discussed with the patient and/or caregiver at length.   As such, the patient has been evaluated and assessed, work-up was performed and treatment was provided in alignment with urgent care protocols and evidence based medicine.  Patient/parent/caregiver has been advised that the patient may require follow up for further testing and treatment if the symptoms continue in spite of treatment, as clinically indicated and appropriate.  If the patient was tested for COVID-19, Influenza and/or RSV, then the patient/parent/guardian was advised to isolate at home pending the results of his/her diagnostic coronavirus test and potentially longer if theyre positive. I have also advised pt that if his/her COVID-19 test returns positive, it's recommended to self-isolate for at least 10 days after symptoms first appeared AND until fever-free for 24 hours without fever reducer AND other symptoms have improved or resolved. Discussed self-isolation recommendations as well as instructions for household member/close contacts as per the [facility] and [place] [facility], and also gave patient the COVID packet with this information.  Patient/parent/caregiver has been advised to return to the [facility] or PCP in 3-5 days if no better; to PCP or the Emergency Department if  new signs and symptoms develop, or if the current signs or symptoms continue to change or worsen for further workup,  evaluation and treatment as clinically indicated and appropriate  The patient will follow up with their current PCP if and as advised. If the patient does not currently have a PCP we will assist them in obtaining one.   The patient may need specialty follow up if the symptoms continue, in spite of conservative treatment and management, for further workup, evaluation, consultation and treatment as clinically indicated and appropriate.  Patient/parent/caregiver verbalized understanding and agreement of plan as discussed.  All questions were addressed during visit.  Please see discharge instructions below for further details of plan.  Discharge Instructions:   Discharge Instructions      Your symptoms and physical exam findings are concerning for a viral respiratory infection that has become complicated with the presence of bacteria.  I provided you with a prescription for azithromycin, please take 2 tablets on day 1 and 1 tablet every day thereafter for coverage of possible early community-acquired pneumonia.   Conservative care is also recommended at this time.  This includes rest, pushing clear fluids and activity as tolerated.  Warm beverages such as teas and broths versus cold beverages/popsicles and frozen sherbet/sorbet are personal choice, both warm and cold are beneficial.  You may also notice that your appetite is reduced; this is okay as long as you are drinking plenty of clear fluids.    Please see the list below for recommended medications, dosages and frequencies to provide relief of your current symptoms:     Viscous lidocaine (Xylocaine): This is a very effective throat pain reliever, swish 15 mL for 10 to 15 seconds then swallow slowly.  This can be repeated every 3 hours while awake.  I have provided you with a prescription.  Guaifenesin (Robitussin, Mucinex): This is an expectorant.  This helps break up chest congestion and loosen up thick nasal drainage making phlegm and  drainage more liquid and therefore easier to remove.  I recommend being 400 mg three times daily as needed.  This has been prescribed for you.   Promethazine DM: Promethazine is both the nasal decongestant and an antinausea medication that makes most patients feel fairly sleepy.  The DM is dextromethorphan, a cough suppressant found many over-the-counter cough medications.  Please take 5 mL before bedtime to help you sleep better, minimize your cough.  I have provided you with a prescription for this medication.      Ipratropium (Atrovent): This is an excellent nasal decongestant spray that does not cause rebound congestion, can be used up to 4 times daily as needed, instill 2 sprays into each nare with each use.  I have provided you with a prescription for this medication.      Albuterol HFA: This is a bronchodilator, it relaxes the smooth muscles that constrict your airway in your lungs when you are feeling sick or having inflammation secondary to allergies.  Please inhale 2 puffs twice daily every day using the spacer provided.  You can also inhale 2 more puffs as often as needed throughout the day for aggravating cough, chest tightness, feeling short of breath, wheezing.  There would be a prescription for albuterol and a spacer at your pharmacy.   Methylprednisolone (Medrol Dosepak): This is a steroid that will significantly calm your upper and lower airways, please take one row of tablets daily with your breakfast meal starting tomorrow morning until the prescription is complete.  Because I know that the sum total of these medications are definitely going to cause you some stomach upset, I have also provided you with a prescription for famotidine (Pepcid) that you should take twice daily at least while you are in this acute phase to prevent stomach upset and heartburn.  I have provided you with a prescription for this as well.  You can certainly continue Tylenol for fever, if any.  Please avoid  ibuprofen while you are taking methylprednisolone.   Please follow-up within the next 3 to 5 days either with your primary care provider or urgent care if your symptoms do not resolve.  If you do not have a primary care provider, we will assist you in finding one.      This office note has been dictated using Teaching laboratory technician.  Unfortunately, and despite my best efforts, this method of dictation can sometimes lead to occasional typographical or grammatical errors.  I apologize in advance if this occurs.         Theadora Rama Scales, PA-C 04/06/21 1623

## 2021-08-30 ENCOUNTER — Ambulatory Visit: Payer: Federal, State, Local not specified - PPO | Admitting: Family Medicine

## 2021-08-30 ENCOUNTER — Encounter: Payer: Self-pay | Admitting: Family Medicine

## 2021-08-30 VITALS — BP 140/88 | HR 73 | Temp 96.1°F | Ht 63.0 in | Wt 218.8 lb

## 2021-08-30 DIAGNOSIS — L819 Disorder of pigmentation, unspecified: Secondary | ICD-10-CM | POA: Diagnosis not present

## 2021-08-30 DIAGNOSIS — Z6838 Body mass index (BMI) 38.0-38.9, adult: Secondary | ICD-10-CM

## 2021-08-30 DIAGNOSIS — Z1211 Encounter for screening for malignant neoplasm of colon: Secondary | ICD-10-CM

## 2021-08-30 DIAGNOSIS — I10 Essential (primary) hypertension: Secondary | ICD-10-CM | POA: Diagnosis not present

## 2021-08-30 MED ORDER — AMLODIPINE BESYLATE 5 MG PO TABS: 5.0000 mg | ORAL_TABLET | Freq: Every day | ORAL | 3 refills | Status: DC

## 2021-08-30 MED ORDER — LOSARTAN POTASSIUM-HCTZ 100-25 MG PO TABS: 1.0000 | ORAL_TABLET | Freq: Every day | ORAL | 3 refills | Status: DC

## 2021-08-30 NOTE — Assessment & Plan Note (Signed)
Stable On amlodipine 5 mg and losartan 100 mg/HCTZ 25 mg Refill Restratification labs Follow-up 6 months

## 2021-08-30 NOTE — Patient Instructions (Signed)
Please refill your blood pressure medicine We are checking routine labs today including blood sugar, cholesterol, kidneys For the statin rash, we are checking for diabetes and referring to dermatology

## 2021-08-30 NOTE — Progress Notes (Signed)
   Bailey Ward is a 56 y.o. female who presents today for an office visit.  Assessment/Plan:  New/Acute Problems: Hyperpigmentation Appears to be acanthosis nigricans, although differential is broad with everything from tinea versicolor (although typically hypopigmented) to Addison's disease Test for diabetes with hemoglobin A1c Referral to dermatology, can cancel if elevated A1c  Chronic Problems Addressed Today: Hypertensive disorder Stable On amlodipine 5 mg and losartan 100 mg/HCTZ 25 mg Refill Restratification labs Follow-up 6 months      Subjective:  HPI:  Patient with  has a past medical history of Allergy and Hypertension. presents with medication refill and blood pressure follow-up.   Patient says she is doing well on her current medications including amlodipine and hydrochlorothiazide and losartan.  Denies any chest pain, shortness of breath, worsening lower extremity swelling, headache, blurred vision Patient also reports ongoing rash.  Patient also presents with rash that is developed on neck and flexural region of both arms.  It is getting worse.  It is occurred over the past year.  It is spreading.  It is not associated with itching, flaking, burning, drainage.  Patient denies any fevers or chills.  Patient denies any new topical medications or over-the-counter lotions or significant sun exposure.  Denies any rapid weight gain, acne, facial hair growth          Objective:  Physical Exam: BP 140/88 (BP Location: Left Arm, Patient Position: Sitting, Cuff Size: Large)   Pulse 73   Temp (!) 96.1 F (35.6 C) (Temporal)   Ht 5\' 3"  (1.6 m)   Wt 218 lb 12.8 oz (99.2 kg)   SpO2 98%   BMI 38.76 kg/m   Gen: No acute distress, resting comfortably CV: Regular rate and rhythm with no murmurs appreciated Pulm: Normal work of breathing, clear to auscultation bilaterally with no crackles, wheezes, or rhonchi Skin: Hyperpigmented macules located along  circumferential neck and flexural regions of bilateral arms, there is no flaking, nontender Neuro: Grossly normal, moves all extremities Psych: Normal affect and thought content       08/30/2021 2:29 PM

## 2021-08-31 LAB — COMPREHENSIVE METABOLIC PANEL
ALT: 11 U/L (ref 0–35)
AST: 17 U/L (ref 0–37)
Albumin: 4.3 g/dL (ref 3.5–5.2)
Alkaline Phosphatase: 57 U/L (ref 39–117)
BUN: 16 mg/dL (ref 6–23)
CO2: 27 mEq/L (ref 19–32)
Calcium: 9.8 mg/dL (ref 8.4–10.5)
Chloride: 97 mEq/L (ref 96–112)
Creatinine, Ser: 1.31 mg/dL — ABNORMAL HIGH (ref 0.40–1.20)
GFR: 45.83 mL/min — ABNORMAL LOW (ref >60.00)
Glucose, Bld: 132 mg/dL — ABNORMAL HIGH (ref 70–99)
Potassium: 3.3 mEq/L — ABNORMAL LOW (ref 3.5–5.1)
Sodium: 139 mEq/L (ref 135–145)
Total Bilirubin: 0.5 mg/dL (ref 0.2–1.2)
Total Protein: 7.8 g/dL (ref 6.0–8.3)

## 2021-08-31 LAB — LIPID PANEL
Cholesterol: 226 mg/dL — ABNORMAL HIGH (ref 0–200)
HDL: 36.7 mg/dL — ABNORMAL LOW (ref >39.00)
NonHDL: 189.44
Total CHOL/HDL Ratio: 6
Triglycerides: 307 mg/dL — ABNORMAL HIGH (ref 0.0–149.0)
VLDL: 61.4 mg/dL — ABNORMAL HIGH (ref 0.0–40.0)

## 2021-08-31 LAB — HEMOGLOBIN A1C: Hgb A1c MFr Bld: 6.4 % (ref 4.6–6.5)

## 2021-08-31 LAB — LDL CHOLESTEROL, DIRECT: Direct LDL: 128 mg/dL

## 2021-11-23 DIAGNOSIS — R3912 Poor urinary stream: Secondary | ICD-10-CM | POA: Diagnosis not present

## 2021-11-23 DIAGNOSIS — Z6837 Body mass index (BMI) 37.0-37.9, adult: Secondary | ICD-10-CM | POA: Diagnosis not present

## 2021-11-23 DIAGNOSIS — Z1231 Encounter for screening mammogram for malignant neoplasm of breast: Secondary | ICD-10-CM | POA: Diagnosis not present

## 2021-11-23 DIAGNOSIS — Z01419 Encounter for gynecological examination (general) (routine) without abnormal findings: Secondary | ICD-10-CM | POA: Diagnosis not present

## 2021-11-23 LAB — HM MAMMOGRAPHY

## 2021-12-31 ENCOUNTER — Telehealth: Payer: Self-pay

## 2021-12-31 NOTE — Telephone Encounter (Signed)
Received fax from [facility] advising that patient didn't pick up her rx for amlodipine 5 mg 90 supply. Contacted pharmacy for confirmation and they stated she picked up 42 rx, but didn't pick up August. I contact patient and she stated that she still had remaining doses from previous rx from urgent care and will go to the pharmacy to pick up her remaining dose today or tomorrow.

## 2022-02-21 ENCOUNTER — Encounter: Payer: Self-pay | Admitting: Family Medicine

## 2022-02-21 ENCOUNTER — Ambulatory Visit: Payer: Federal, State, Local not specified - PPO | Admitting: Family Medicine

## 2022-02-21 VITALS — BP 128/84 | HR 80 | Temp 97.6°F | Wt 220.6 lb

## 2022-02-21 DIAGNOSIS — E1169 Type 2 diabetes mellitus with other specified complication: Secondary | ICD-10-CM

## 2022-02-21 DIAGNOSIS — I152 Hypertension secondary to endocrine disorders: Secondary | ICD-10-CM

## 2022-02-21 DIAGNOSIS — E785 Hyperlipidemia, unspecified: Secondary | ICD-10-CM

## 2022-02-21 DIAGNOSIS — I10 Essential (primary) hypertension: Secondary | ICD-10-CM | POA: Diagnosis not present

## 2022-02-21 DIAGNOSIS — N1831 Chronic kidney disease, stage 3a: Secondary | ICD-10-CM

## 2022-02-21 DIAGNOSIS — Z6838 Body mass index (BMI) 38.0-38.9, adult: Secondary | ICD-10-CM

## 2022-02-21 DIAGNOSIS — Z1211 Encounter for screening for malignant neoplasm of colon: Secondary | ICD-10-CM

## 2022-02-21 DIAGNOSIS — L309 Dermatitis, unspecified: Secondary | ICD-10-CM | POA: Insufficient documentation

## 2022-02-21 DIAGNOSIS — E1122 Type 2 diabetes mellitus with diabetic chronic kidney disease: Secondary | ICD-10-CM

## 2022-02-21 DIAGNOSIS — I129 Hypertensive chronic kidney disease with stage 1 through stage 4 chronic kidney disease, or unspecified chronic kidney disease: Secondary | ICD-10-CM

## 2022-02-21 DIAGNOSIS — L308 Other specified dermatitis: Secondary | ICD-10-CM

## 2022-02-21 DIAGNOSIS — E876 Hypokalemia: Secondary | ICD-10-CM

## 2022-02-21 DIAGNOSIS — N183 Chronic kidney disease, stage 3 unspecified: Secondary | ICD-10-CM

## 2022-02-21 DIAGNOSIS — E1159 Type 2 diabetes mellitus with other circulatory complications: Secondary | ICD-10-CM

## 2022-02-21 LAB — CBC WITH DIFFERENTIAL/PLATELET
Basophils Absolute: 0 10*3/uL (ref 0.0–0.1)
Basophils Relative: 0.6 % (ref 0.0–3.0)
Eosinophils Absolute: 0.4 10*3/uL (ref 0.0–0.7)
Eosinophils Relative: 7.2 % — ABNORMAL HIGH (ref 0.0–5.0)
HCT: 39.6 % (ref 36.0–46.0)
Hemoglobin: 13 g/dL (ref 12.0–15.0)
Lymphocytes Relative: 33.9 % (ref 12.0–46.0)
Lymphs Abs: 1.9 10*3/uL (ref 0.7–4.0)
MCHC: 32.7 g/dL (ref 30.0–36.0)
MCV: 84.1 fl (ref 78.0–100.0)
Monocytes Absolute: 0.4 10*3/uL (ref 0.1–1.0)
Monocytes Relative: 6.7 % (ref 3.0–12.0)
Neutro Abs: 2.8 10*3/uL (ref 1.4–7.7)
Neutrophils Relative %: 51.6 % (ref 43.0–77.0)
Platelets: 379 10*3/uL (ref 150.0–400.0)
RBC: 4.71 Mil/uL (ref 3.87–5.11)
RDW: 14.9 % (ref 11.5–15.5)
WBC: 5.5 10*3/uL (ref 4.0–10.5)

## 2022-02-21 LAB — LDL CHOLESTEROL, DIRECT: Direct LDL: 118 mg/dL

## 2022-02-21 LAB — LIPID PANEL
Cholesterol: 203 mg/dL — ABNORMAL HIGH (ref 0–200)
HDL: 37.9 mg/dL — ABNORMAL LOW (ref >39.00)
NonHDL: 165.35
Total CHOL/HDL Ratio: 5
Triglycerides: 243 mg/dL — ABNORMAL HIGH (ref 0.0–149.0)
VLDL: 48.6 mg/dL — ABNORMAL HIGH (ref 0.0–40.0)

## 2022-02-21 LAB — COMPREHENSIVE METABOLIC PANEL
ALT: 11 U/L (ref 0–35)
AST: 14 U/L (ref 0–37)
Albumin: 4.4 g/dL (ref 3.5–5.2)
Alkaline Phosphatase: 99 U/L (ref 39–117)
BUN: 16 mg/dL (ref 6–23)
CO2: 32 mEq/L (ref 19–32)
Calcium: 9.7 mg/dL (ref 8.4–10.5)
Chloride: 100 mEq/L (ref 96–112)
Creatinine, Ser: 1.32 mg/dL — ABNORMAL HIGH (ref 0.40–1.20)
GFR: 45.26 mL/min — ABNORMAL LOW (ref >60.00)
Glucose, Bld: 90 mg/dL (ref 70–99)
Potassium: 4.2 mEq/L (ref 3.5–5.1)
Sodium: 139 mEq/L (ref 135–145)
Total Bilirubin: 0.3 mg/dL (ref 0.2–1.2)
Total Protein: 7.6 g/dL (ref 6.0–8.3)

## 2022-02-21 LAB — FSH/LH
FSH: 84.4 m[IU]/mL
LH: 48.1 m[IU]/mL

## 2022-02-21 LAB — MICROALBUMIN / CREATININE URINE RATIO
Creatinine,U: 132.4 mg/dL
Microalb Creat Ratio: 0.7 mg/g (ref 0.0–30.0)
Microalb, Ur: 0.9 mg/dL (ref 0.0–1.9)

## 2022-02-21 LAB — HEMOGLOBIN A1C: Hgb A1c MFr Bld: 6.7 % — ABNORMAL HIGH (ref 4.6–6.5)

## 2022-02-21 LAB — TSH: TSH: 3.05 u[IU]/mL (ref 0.35–5.50)

## 2022-02-21 MED ORDER — TRIAMCINOLONE ACETONIDE 0.1 % EX CREA: 1.0000 | TOPICAL_CREAM | Freq: Two times a day (BID) | CUTANEOUS | 0 refills | Status: DC

## 2022-02-21 MED ORDER — ROSUVASTATIN CALCIUM 20 MG PO TABS: 20.0000 mg | ORAL_TABLET | Freq: Every day | ORAL | 3 refills | Status: DC

## 2022-02-21 NOTE — Patient Instructions (Signed)
We are rechecking labs as discussed.  Use triamcinolone ointment as prescribed.  Work on diet and exercise as discussed.

## 2022-02-21 NOTE — Addendum Note (Signed)
Addended by: Fanny Bien B on: 02/21/2022 04:05 PM   Modules accepted: Orders

## 2022-02-21 NOTE — Assessment & Plan Note (Signed)
Trial triamcinolone ointment Patient has follow-up with dermatology within the month

## 2022-02-21 NOTE — Assessment & Plan Note (Signed)
Associate with hyperlipidemia, hypokalemia, CKD 3 We will check restratification labs

## 2022-02-21 NOTE — Assessment & Plan Note (Signed)
Counseled on Lifestyle Modifications Including Exercise, Mediterranean Diet, Tracking Food with General Recommended for Dietitian, Patient States She Will Follow-Up with 1 That She Knows Restratification labs Consider medication versus referral to weight management clinic at follow-up

## 2022-02-21 NOTE — Progress Notes (Signed)
Assessment/Plan:   Problem List Items Addressed This Visit       Cardiovascular and Mediastinum   Essential hypertension    Associate with hyperlipidemia, hypokalemia, CKD 3 We will check restratification labs      Relevant Orders   TSH   Hemoglobin A1c   Microalbumin / creatinine urine ratio   Urinalysis, Routine w reflex microscopic   CBC with Differential/Platelet   Comprehensive metabolic panel     Musculoskeletal and Integument   Eczema    Trial triamcinolone ointment Patient has follow-up with dermatology within the month      Relevant Medications   triamcinolone cream (KENALOG) 0.1 %     Other   Obesity    Counseled on Lifestyle Modifications Including Exercise, Mediterranean Diet, Tracking Food with General Recommended for Dietitian, Patient States She Will Follow-Up with 1 That She Knows Restratification labs Consider medication versus referral to weight management clinic at follow-up       Relevant Orders   FSH/LH   TSH   Lipid panel   Hemoglobin A1c   Microalbumin / creatinine urine ratio   Urinalysis, Routine w reflex microscopic   CBC with Differential/Platelet   Comprehensive metabolic panel   Other Visit Diagnoses     Hyperlipidemia, unspecified hyperlipidemia type    -  Primary   Relevant Orders   Lipid panel   Hemoglobin A1c   Screening for colon cancer       Relevant Orders   Cologuard   Hypokalemia       Benign hypertension with CKD (chronic kidney disease) stage III (HCC)              Subjective:  HPI:  Bailey Ward is a 56 y.o. female who has Essential hypertension; Hair loss; Encounter for general adult medical examination with abnormal findings; Obesity; Vitamin D deficiency; Chronic pain of left knee; Idiopathic chronic venous hypertension of both lower extremities with inflammation; S/P hysterectomy; Hyperpigmentation; and Eczema on their problem list..   She  has a past medical history of Allergy and  Hypertension..   She presents with chief complaint of Follow-up (6 month f/u b/p. Patient would like to discuss colonoscopy options.) .   Obesity.  Patient reports that she has had difficulty losing weight.  She finds it difficult to eat healthy and exercise as patient travels a lot for her job.  Discussed multiple ways to approach weight loss including diet and exercise, referral to dietitian and medication therapies.  HKF2X with hypokalemia.  Patient with a history of CKD 3 secondary to hypertension.  She has had had mild hypokalemia in the past.  This is likely from patient's thiazide.  Patient has no symptoms of chest pain, shortness of breath, heart palpitation.  Hypertension, established problem,Stable BP Readings from Last 3 Encounters:  02/21/22 128/84  08/30/21 140/88  04/06/21 (!) 143/84    Current Medications: Losartan HCTZ amlodipine, compliant without side effects. Interim History:   ROS: Denies any chest pain, shortness of breath, dyspnea on exertion, leg edema.    Hyperlipidemia, established problem,  Lab Results  Component Value Date   CHOL 226 (H) 08/30/2021   HDL 36.70 (L) 08/30/2021   LDLCALC 117 (H) 05/03/2020   LDLDIRECT 128.0 08/30/2021   TRIG 307.0 (H) 08/30/2021   CHOLHDL 6 08/30/2021   The 10-year ASCVD risk score (Arnett DK, et al., 2019) is: 4.8%   Values used to calculate the score:     Age: 44 years  Sex: Female     Is Non-Hispanic African American: No     Diabetic: No     Tobacco smoker: No     Systolic Blood Pressure: 128 mmHg     Is BP treated: Yes     HDL Cholesterol: 36.7 mg/dL     Total Cholesterol: 226 mg/dL   ROS: No chest pain or shortness of breath. No myalgias.  Eczema: Patient complains of rash on anterior flexural surface of neck. Onset of symptoms several weeks ago, and have been unchanged since that time.  There is scaling and itching.  Patient has tried multiple over-the-counter lotions and ointments without  improvement.   Past Surgical History:  Procedure Laterality Date   ABDOMINAL HYSTERECTOMY      Outpatient Medications Prior to Visit  Medication Sig Dispense Refill   albuterol (VENTOLIN HFA) 108 (90 Base) MCG/ACT inhaler Inhale 2 puffs into the lungs every 6 (six) hours as needed for wheezing or shortness of breath (Cough). 18 g 0   amLODipine (NORVASC) 5 MG tablet Take 1 tablet (5 mg total) by mouth daily. 90 tablet 3   cetirizine (ZYRTEC) 10 MG tablet Take 10 mg by mouth daily as needed for allergies (only in allergy season).     losartan-hydrochlorothiazide (HYZAAR) 100-25 MG tablet Take 1 tablet by mouth daily. 90 tablet 3   No facility-administered medications prior to visit.    Family History  Problem Relation Age of Onset   Hypertension Mother    Diabetes Father    Hypertension Father     Social History   Socioeconomic History   Marital status: Married    Spouse name: Not on file   Number of children: 2   Years of education: 44   Highest education level: Not on file  Occupational History   Occupation: Annuity Specialist  Tobacco Use   Smoking status: Never   Smokeless tobacco: Never  Vaping Use   Vaping Use: Never used  Substance and Sexual Activity   Alcohol use: No   Drug use: No   Sexual activity: Yes  Other Topics Concern   Not on file  Social History Narrative   Born in [place], [place] and raised in [place]. Fun: Travel, go to [employer], watch her daughter play soccer, watch movies   Denies religious beliefs that would effect health care.    Denies abuse and feels safe at home.    Social Determinants of Health   Financial Resource Strain: Not on file  Food Insecurity: Not on file  Transportation Needs: Not on file  Physical Activity: Not on file  Stress: Not on file  Social Connections: Not on file  Intimate Partner Violence: Not on file                                                                                                 Objective:   Physical Exam: BP 128/84 (BP Location: Left Arm, Patient Position: Sitting, Cuff Size: Large)   Pulse 80   Temp 97.6 F (36.4 C) (Temporal)   Wt 220 lb 9.6 oz (100.1 kg)   SpO2  98%   BMI 39.08 kg/m    General: No acute distress. Awake and conversant.  Eyes: Normal conjunctiva, anicteric. Round symmetric pupils.  ENT: Hearing grossly intact. No nasal discharge.  Neck: Neck is supple. No masses or thyromegaly.  Respiratory: Respirations are non-labored. No auditory wheezing.  Clear to auscultation bilaterally Skin: Warm.  Scaling under neck without erythema, or drainage Psych: Alert and oriented. Cooperative, Appropriate mood and affect, Normal judgment.  CV: No cyanosis or JVD, RRR, no MRG MSK: Normal ambulation. No clubbing  Neuro: Sensation and CN II-XII grossly normal.        Garner Nash, MD, MS

## 2022-03-07 DIAGNOSIS — L981 Factitial dermatitis: Secondary | ICD-10-CM | POA: Diagnosis not present

## 2022-03-26 DIAGNOSIS — H25043 Posterior subcapsular polar age-related cataract, bilateral: Secondary | ICD-10-CM | POA: Diagnosis not present

## 2022-03-26 DIAGNOSIS — H2512 Age-related nuclear cataract, left eye: Secondary | ICD-10-CM | POA: Diagnosis not present

## 2022-03-26 DIAGNOSIS — H2513 Age-related nuclear cataract, bilateral: Secondary | ICD-10-CM | POA: Diagnosis not present

## 2022-03-26 DIAGNOSIS — H18413 Arcus senilis, bilateral: Secondary | ICD-10-CM | POA: Diagnosis not present

## 2022-03-26 DIAGNOSIS — H25013 Cortical age-related cataract, bilateral: Secondary | ICD-10-CM | POA: Diagnosis not present

## 2022-04-03 LAB — COLOGUARD: COLOGUARD: NEGATIVE

## 2022-05-24 ENCOUNTER — Telehealth: Payer: Self-pay | Admitting: *Deleted

## 2022-05-24 ENCOUNTER — Ambulatory Visit: Payer: Federal, State, Local not specified - PPO | Admitting: Family Medicine

## 2022-05-24 NOTE — Telephone Encounter (Signed)
Pt showed up at 9:00 for her 8:00 app with Dr Grandville Silos on 05/24/22, I sent a letter.

## 2022-05-29 ENCOUNTER — Ambulatory Visit: Payer: Federal, State, Local not specified - PPO | Admitting: Family Medicine

## 2022-05-29 ENCOUNTER — Telehealth: Payer: Self-pay | Admitting: Family Medicine

## 2022-05-29 NOTE — Telephone Encounter (Signed)
2.21.24 no show letter sent

## 2022-06-13 NOTE — Telephone Encounter (Signed)
1st no show, fee waived, letter sent to reschedule/notify of policy Pt has rescheduled for 06/20/2022

## 2022-06-20 ENCOUNTER — Ambulatory Visit: Payer: Federal, State, Local not specified - PPO | Admitting: Family Medicine

## 2022-06-20 ENCOUNTER — Encounter: Payer: Self-pay | Admitting: Family Medicine

## 2022-06-20 VITALS — BP 128/82 | HR 75 | Temp 97.5°F | Wt 222.6 lb

## 2022-06-20 DIAGNOSIS — E785 Hyperlipidemia, unspecified: Secondary | ICD-10-CM

## 2022-06-20 DIAGNOSIS — E1159 Type 2 diabetes mellitus with other circulatory complications: Secondary | ICD-10-CM | POA: Diagnosis not present

## 2022-06-20 DIAGNOSIS — N1831 Chronic kidney disease, stage 3a: Secondary | ICD-10-CM

## 2022-06-20 DIAGNOSIS — E1122 Type 2 diabetes mellitus with diabetic chronic kidney disease: Secondary | ICD-10-CM | POA: Diagnosis not present

## 2022-06-20 DIAGNOSIS — E1169 Type 2 diabetes mellitus with other specified complication: Secondary | ICD-10-CM | POA: Diagnosis not present

## 2022-06-20 DIAGNOSIS — I152 Hypertension secondary to endocrine disorders: Secondary | ICD-10-CM

## 2022-06-20 DIAGNOSIS — E66812 Obesity, class 2: Secondary | ICD-10-CM

## 2022-06-20 LAB — COMPREHENSIVE METABOLIC PANEL
ALT: 10 U/L (ref 0–35)
AST: 14 U/L (ref 0–37)
Albumin: 4.2 g/dL (ref 3.5–5.2)
Alkaline Phosphatase: 80 U/L (ref 39–117)
BUN: 14 mg/dL (ref 6–23)
CO2: 29 mEq/L (ref 19–32)
Calcium: 9.7 mg/dL (ref 8.4–10.5)
Chloride: 99 mEq/L (ref 96–112)
Creatinine, Ser: 1.27 mg/dL — ABNORMAL HIGH (ref 0.40–1.20)
GFR: 47.3 mL/min — ABNORMAL LOW (ref >60.00)
Glucose, Bld: 96 mg/dL (ref 70–99)
Potassium: 4.2 mEq/L (ref 3.5–5.1)
Sodium: 138 mEq/L (ref 135–145)
Total Bilirubin: 0.4 mg/dL (ref 0.2–1.2)
Total Protein: 7.8 g/dL (ref 6.0–8.3)

## 2022-06-20 LAB — CBC WITH DIFFERENTIAL/PLATELET
Basophils Absolute: 0 10*3/uL (ref 0.0–0.1)
Basophils Relative: 0.5 % (ref 0.0–3.0)
Eosinophils Absolute: 0.2 10*3/uL (ref 0.0–0.7)
Eosinophils Relative: 3.7 % (ref 0.0–5.0)
HCT: 41.7 % (ref 36.0–46.0)
Hemoglobin: 13.3 g/dL (ref 12.0–15.0)
Lymphocytes Relative: 41 % (ref 12.0–46.0)
Lymphs Abs: 2.1 10*3/uL (ref 0.7–4.0)
MCHC: 31.9 g/dL (ref 30.0–36.0)
MCV: 84.1 fl (ref 78.0–100.0)
Monocytes Absolute: 0.4 10*3/uL (ref 0.1–1.0)
Monocytes Relative: 8.3 % (ref 3.0–12.0)
Neutro Abs: 2.4 10*3/uL (ref 1.4–7.7)
Neutrophils Relative %: 46.5 % (ref 43.0–77.0)
Platelets: 386 10*3/uL (ref 150.0–400.0)
RBC: 4.95 Mil/uL (ref 3.87–5.11)
RDW: 15.1 % (ref 11.5–15.5)
WBC: 5.1 10*3/uL (ref 4.0–10.5)

## 2022-06-20 LAB — TSH: TSH: 2.08 u[IU]/mL (ref 0.35–5.50)

## 2022-06-20 LAB — URINALYSIS, ROUTINE W REFLEX MICROSCOPIC
Bilirubin Urine: NEGATIVE
Hgb urine dipstick: NEGATIVE
Ketones, ur: NEGATIVE
Leukocytes,Ua: NEGATIVE
Nitrite: NEGATIVE
Specific Gravity, Urine: 1.025 (ref 1.000–1.030)
Total Protein, Urine: NEGATIVE
Urine Glucose: NEGATIVE
Urobilinogen, UA: 0.2 (ref 0.0–1.0)
pH: 6 (ref 5.0–8.0)

## 2022-06-20 LAB — POCT GLYCOSYLATED HEMOGLOBIN (HGB A1C): Hemoglobin A1C: 5.8 % — AB (ref 4.0–5.6)

## 2022-06-20 LAB — MICROALBUMIN / CREATININE URINE RATIO
Creatinine,U: 169.7 mg/dL
Microalb Creat Ratio: 1.3 mg/g (ref 0.0–30.0)
Microalb, Ur: 2.2 mg/dL — ABNORMAL HIGH (ref 0.0–1.9)

## 2022-06-20 LAB — LIPID PANEL
Cholesterol: 221 mg/dL — ABNORMAL HIGH (ref 0–200)
HDL: 37.3 mg/dL — ABNORMAL LOW (ref >39.00)
NonHDL: 183.36
Total CHOL/HDL Ratio: 6
Triglycerides: 308 mg/dL — ABNORMAL HIGH (ref 0.0–149.0)
VLDL: 61.6 mg/dL — ABNORMAL HIGH (ref 0.0–40.0)

## 2022-06-20 LAB — LDL CHOLESTEROL, DIRECT: Direct LDL: 113 mg/dL

## 2022-06-20 MED ORDER — ROSUVASTATIN CALCIUM 20 MG PO TABS: 20.0000 mg | ORAL_TABLET | Freq: Every day | ORAL | 3 refills | Status: DC

## 2022-06-20 NOTE — Assessment & Plan Note (Signed)
Stable on current medications Reassess renal function

## 2022-06-20 NOTE — Patient Instructions (Signed)
Blood sugars well-controlled. Blood pressure is also well-controlled. We are checking additional labs to assess kidney and electrolytes, cholesterol.  Good luck on your recent studies.  For cholesterol, please start rosuvastatin.

## 2022-06-20 NOTE — Progress Notes (Signed)
Assessment/Plan:   Problem List Items Addressed This Visit       Cardiovascular and Mediastinum   Hypertension associated with diabetes ([place]) - Primary    Stable on current medications Reassess renal function      Relevant Medications   losartan (COZAAR) 25 MG tablet   rosuvastatin (CRESTOR) 20 MG tablet   Other Relevant Orders   TSH   Lipid panel   Microalbumin / creatinine urine ratio   Urinalysis, Routine w reflex microscopic   CBC with Differential/Platelet   Comprehensive metabolic panel   POCT glycosylated hemoglobin (Hb A1C) (Completed)     Endocrine   Hyperlipidemia associated with type 2 diabetes mellitus (HCC)   Relevant Medications   losartan (COZAAR) 25 MG tablet   rosuvastatin (CRESTOR) 20 MG tablet   Other Relevant Orders   TSH   Lipid panel   Microalbumin / creatinine urine ratio   Urinalysis, Routine w reflex microscopic   CBC with Differential/Platelet   Comprehensive metabolic panel   POCT glycosylated hemoglobin (Hb A1C) (Completed)   Type 2 diabetes mellitus with stage 3a chronic kidney disease, without long-term current use of insulin (HCC)    Improvement with lifestyle modifications On ARB and starting statin      Relevant Medications   losartan (COZAAR) 25 MG tablet   rosuvastatin (CRESTOR) 20 MG tablet   Other Relevant Orders   TSH   Lipid panel   Microalbumin / creatinine urine ratio   Urinalysis, Routine w reflex microscopic   CBC with Differential/Platelet   Comprehensive metabolic panel   POCT glycosylated hemoglobin (Hb A1C) (Completed)     Other   Obesity   Relevant Medications   rosuvastatin (CRESTOR) 20 MG tablet   Other Relevant Orders   TSH   Lipid panel   Microalbumin / creatinine urine ratio   Urinalysis, Routine w reflex microscopic   CBC with Differential/Platelet   Comprehensive metabolic panel   POCT glycosylated hemoglobin (Hb A1C) (Completed)    Medications Discontinued During This Encounter  Medication  Reason   rosuvastatin (CRESTOR) 20 MG tablet Reorder      Subjective:  HPI: Encounter date: 06/20/2022  Bailey Ward is a 57 y.o. female who has Hypertension associated with diabetes ([place]); Hair loss; Encounter for general adult medical examination with abnormal findings; Obesity; Vitamin D deficiency; Chronic pain of left knee; Idiopathic chronic venous hypertension of both lower extremities with inflammation; S/P hysterectomy; Hyperpigmentation; Eczema; Hyperlipidemia associated with type 2 diabetes mellitus ([place]); and Type 2 diabetes mellitus with stage 3a chronic kidney disease, without long-term current use of insulin ([place]) on their problem list..   She  has a past medical history of Allergy and Hypertension..   CHIEF COMPLAINT: Bailey Ward is a 57 year old female presenting for routine follow-up and lab evaluation.  HISTORY OF PRESENT ILLNESS:  Hypertension with Diabetes: The patient reports overall good health status and ongoing weight management efforts. Planning to use Zyrtec for seasonal allergies in the upcoming month.  Diabetes Mellitus: Hemoglobin A1C levels have shown improvement. The patient has attempted to adhere to lifestyle modifications for blood sugar management but acknowledges inconsistencies.  Chronic Kidney Disease (Stage 3a): Previous evaluation indicated stable but longstanding kidney disease, with the last GFR noted at 45.  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 She is now focusing on academic studies which have proven beneficial for her stress levels.  ROS:  Cardiology: Denies chest pain  or shortness of breath.  Endocrine: Denies increased thirst or frequent urination.  Musculoskeletal: Denies muscle pains.  Allergies: Plans to use Zyrtec for upcoming seasonal allergies.  Remainder of ROS negative.  Past Surgical History:  Procedure Laterality Date    ABDOMINAL HYSTERECTOMY      Outpatient Medications Prior to Visit  Medication Sig Dispense Refill   amLODipine (NORVASC) 5 MG tablet Take 1 tablet (5 mg total) by mouth daily. 90 tablet 3   cetirizine (ZYRTEC) 10 MG tablet Take 10 mg by mouth daily as needed for allergies (only in allergy season).     losartan (COZAAR) 25 MG tablet Take 25 mg by mouth daily.     losartan-hydrochlorothiazide (HYZAAR) 100-25 MG tablet Take 1 tablet by mouth daily. 90 tablet 3   triamcinolone cream (KENALOG) 0.1 % Apply 1 Application topically 2 (two) times daily. 30 g 0   albuterol (VENTOLIN HFA) 108 (90 Base) MCG/ACT inhaler Inhale 2 puffs into the lungs every 6 (six) hours as needed for wheezing or shortness of breath (Cough). (Patient not taking: Reported on 06/20/2022) 18 g 0   rosuvastatin (CRESTOR) 20 MG tablet Take 1 tablet (20 mg total) by mouth daily. (Patient not taking: Reported on 06/20/2022) 90 tablet 3   No facility-administered medications prior to visit.    Family History  Problem Relation Age of Onset   Hypertension Mother    Diabetes Father    Hypertension Father     Social History   Socioeconomic History   Marital status: Married    Spouse name: Not on file   Number of children: 2   Years of education: 26   Highest education level: Not on file  Occupational History   Occupation: Annuity Specialist  Tobacco Use   Smoking status: Never   Smokeless tobacco: Never  Vaping Use   Vaping Use: Never used  Substance and Sexual Activity   Alcohol use: No   Drug use: No   Sexual activity: Yes  Other Topics Concern   Not on file  Social History Narrative   Born in [place], [place] and raised in [place]. Fun: Travel, go to [employer], watch her daughter play soccer, watch movies   Denies religious beliefs that would effect health care.    Denies abuse and feels safe at home.    Social Determinants of Health   Financial Resource Strain: Not on file  Food Insecurity: Not on file   Transportation Needs: Not on file  Physical Activity: Not on file  Stress: Not on file  Social Connections: Not on file  Intimate Partner Violence: Not on file                                                                                                 Objective:  Physical Exam: BP 128/82 (BP Location: Left Arm, Patient Position: Sitting, Cuff Size: Large)   Pulse 75   Temp (!) 97.5 F (36.4 C) (Temporal)   Wt 222 lb 9.6 oz (101 kg)   SpO2 97%   BMI 39.43 kg/m   Wt Readings from Last 3 Encounters:  06/20/22 222 lb 9.6 oz (101 kg)  02/21/22 220 lb 9.6 oz (100.1 kg)  08/30/21 218 lb 12.8 oz (99.2 kg)    Physical Exam Constitutional:      General: She is not in acute distress.    Appearance: Normal appearance. She is not ill-appearing or toxic-appearing.  HENT:     Head: Normocephalic and atraumatic.     Nose: Nose normal. No congestion.  Eyes:     General: No scleral icterus.    Extraocular Movements: Extraocular movements intact.  Cardiovascular:     Rate and Rhythm: Normal rate and regular rhythm.     Pulses: Normal pulses.     Heart sounds: Normal heart sounds.  Pulmonary:     Effort: Pulmonary effort is normal. No respiratory distress.     Breath sounds: Normal breath sounds.  Abdominal:     General: Abdomen is flat. Bowel sounds are normal.     Palpations: Abdomen is soft.  Musculoskeletal:        General: Normal range of motion.  Lymphadenopathy:     Cervical: No cervical adenopathy.  Skin:    General: Skin is warm and dry.     Findings: No rash.  Neurological:     General: No focal deficit present.     Mental Status: She is alert and oriented to person, place, and time. Mental status is at baseline.  Psychiatric:        Mood and Affect: Mood normal.        Behavior: Behavior normal.        Thought Content: Thought content normal.        Judgment: Judgment normal.     Recent Results (from the past 2160 hour(s))  POCT glycosylated hemoglobin (Hb  A1C)     Status: Abnormal   Collection Time: 06/20/22  2:02 PM  Result Value Ref Range   Hemoglobin A1C 5.8 (A) 4.0 - 5.6 %   HbA1c POC (<> result, manual entry)     HbA1c, POC (prediabetic range)     HbA1c, POC (controlled diabetic range)          Alesia Banda, MD, MS

## 2022-06-20 NOTE — Assessment & Plan Note (Signed)
Improvement with lifestyle modifications On ARB and starting statin

## 2022-08-20 DIAGNOSIS — H2513 Age-related nuclear cataract, bilateral: Secondary | ICD-10-CM | POA: Diagnosis not present

## 2022-08-20 DIAGNOSIS — H25013 Cortical age-related cataract, bilateral: Secondary | ICD-10-CM | POA: Diagnosis not present

## 2022-08-20 DIAGNOSIS — H25043 Posterior subcapsular polar age-related cataract, bilateral: Secondary | ICD-10-CM | POA: Diagnosis not present

## 2022-08-20 DIAGNOSIS — H18413 Arcus senilis, bilateral: Secondary | ICD-10-CM | POA: Diagnosis not present

## 2022-08-29 ENCOUNTER — Ambulatory Visit: Payer: Federal, State, Local not specified - PPO | Admitting: Family Medicine

## 2022-09-03 ENCOUNTER — Ambulatory Visit: Payer: Federal, State, Local not specified - PPO | Admitting: Family Medicine

## 2022-09-03 ENCOUNTER — Encounter: Payer: Self-pay | Admitting: Family Medicine

## 2022-09-03 VITALS — BP 136/86 | HR 97 | Temp 97.5°F | Wt 227.2 lb

## 2022-09-03 DIAGNOSIS — E785 Hyperlipidemia, unspecified: Secondary | ICD-10-CM

## 2022-09-03 DIAGNOSIS — E1122 Type 2 diabetes mellitus with diabetic chronic kidney disease: Secondary | ICD-10-CM

## 2022-09-03 DIAGNOSIS — E1169 Type 2 diabetes mellitus with other specified complication: Secondary | ICD-10-CM

## 2022-09-03 DIAGNOSIS — M25473 Effusion, unspecified ankle: Secondary | ICD-10-CM | POA: Diagnosis not present

## 2022-09-03 DIAGNOSIS — E1159 Type 2 diabetes mellitus with other circulatory complications: Secondary | ICD-10-CM

## 2022-09-03 DIAGNOSIS — N1831 Chronic kidney disease, stage 3a: Secondary | ICD-10-CM

## 2022-09-03 DIAGNOSIS — I152 Hypertension secondary to endocrine disorders: Secondary | ICD-10-CM

## 2022-09-03 MED ORDER — AMLODIPINE BESYLATE 10 MG PO TABS
10.0000 mg | ORAL_TABLET | Freq: Every day | ORAL | 3 refills | Status: DC
Start: 2022-09-03 — End: 2023-10-01

## 2022-09-03 NOTE — Progress Notes (Signed)
Assessment/Plan:   Problem List Items Addressed This Visit       Cardiovascular and Mediastinum   Hypertension associated with diabetes (HCC) - Primary    Patient's blood pressure is currently 136/86 still elevated. Patient reports long-standing ankle swelling and takes amlodipine 5 mg, losartan 100 mg/hydrochlorothiazide 25 mg.  Plan: Increase amlodipine to 10 mg daily Monitor blood pressure at home and for ankle swelling To avoid frequent clinic visits pre-surgery. Re-evaluate in three months: Check blood pressure, comprehensive metabolic panel, and lipid panel. Lifestyle Modifications: Continue promoting exercise, dietary changes, and weight management.       Relevant Medications   amLODipine (NORVASC) 10 MG tablet   Other Relevant Orders   Comprehensive metabolic panel   Basic Metabolic Panel (BMET)     Endocrine   Hyperlipidemia associated with type 2 diabetes mellitus (HCC)    Current lipid panel shows elevated cholesterol and triglycerides. The patient is on rosuvastatin 20 mg daily without adverse effects.=  Plan:  Continue rosuvastatin 20 mg daily Fasting lipid panel  Dietary counseling: Focus on reducing saturated fats and increasing fiber intake.      Relevant Medications   amLODipine (NORVASC) 10 MG tablet   Other Relevant Orders   Lipid panel   Lipid panel   Type 2 diabetes mellitus with stage 3a chronic kidney disease, without long-term current use of insulin (HCC)   Relevant Orders   Hemoglobin A1c   Basic Metabolic Panel (BMET)     Other   Ankle swelling    Patient experiences chronic ankle swelling likely due to venous insufficiency, possibly aggravated by medications for hypertension.  Differential diagnosis:  Chronic venous insufficiency Drug-induced edema (aka amlodipine Cardiac insufficiency (unlikely based on negative history and unremarkable PE)   Plan:  Compression stockings: Recommended to alleviate swelling. Monitor and evaluate  leg swelling: Ensure no worsening due to medication adjustments. Consider spironolactone if worsening: For additional diuretic effect and reduction of edema.       Medications Discontinued During This Encounter  Medication Reason   losartan (COZAAR) 25 MG tablet    albuterol (VENTOLIN HFA) 108 (90 Base) MCG/ACT inhaler    cetirizine (ZYRTEC) 10 MG tablet    Difluprednate 0.05 % EMUL    gatifloxacin (ZYMAXID) 0.5 % SOLN    ketorolac (ACULAR) 0.5 % ophthalmic solution    amLODipine (NORVASC) 5 MG tablet     Return in about 3 months (around 12/04/2022) for fasting labs (within 1 week and 1 week before next follow up) , BP, DM, HLD.    Subjective:   Encounter date: 09/03/2022  Bailey Ward is a 57 y.o. female who has Hypertension associated with diabetes (HCC); Hair loss; Encounter for general adult medical examination with abnormal findings; Obesity; Vitamin D deficiency; Chronic pain of left knee; Idiopathic chronic venous hypertension of both lower extremities with inflammation; S/P hysterectomy; Hyperpigmentation; Eczema; Hyperlipidemia associated with type 2 diabetes mellitus (HCC); Type 2 diabetes mellitus with stage 3a chronic kidney disease, without long-term current use of insulin (HCC); and Ankle swelling on their problem list..   She  has a past medical history of Allergy and Hypertension..   She presents with chief complaint of Medical Management of Chronic Issues (B/p follow up) .   HPI:   Review of Systems  Constitutional:  Negative for chills, diaphoresis, fever, malaise/fatigue and weight loss.  HENT:  Negative for congestion, ear discharge, ear pain and hearing loss.   Eyes:  Positive for blurred vision (due to needing  new glasses). Negative for double vision, photophobia, pain, discharge and redness.  Respiratory:  Negative for cough, sputum production, shortness of breath and wheezing.   Cardiovascular:  Positive for leg swelling (ankles). Negative for  chest pain and palpitations.  Gastrointestinal:  Negative for abdominal pain, blood in stool, constipation, diarrhea, heartburn, melena, nausea and vomiting.  Genitourinary:  Negative for dysuria, flank pain, frequency, hematuria and urgency.  Musculoskeletal:  Negative for myalgias.  Skin:  Negative for itching and rash.  Neurological:  Negative for dizziness, tingling, tremors, speech change, seizures, loss of consciousness, weakness and headaches.  Psychiatric/Behavioral:  Negative for depression, hallucinations, memory loss, substance abuse and suicidal ideas. The patient does not have insomnia.   All other systems reviewed and are negative.   Past Surgical History:  Procedure Laterality Date   ABDOMINAL HYSTERECTOMY      Outpatient Medications Prior to Visit  Medication Sig Dispense Refill   amoxicillin (AMOXIL) 500 MG capsule Take by mouth 3 (three) times daily.     losartan-hydrochlorothiazide (HYZAAR) 100-25 MG tablet Take 1 tablet by mouth daily. 90 tablet 3   rosuvastatin (CRESTOR) 20 MG tablet Take 1 tablet (20 mg total) by mouth daily. 90 tablet 3   amLODipine (NORVASC) 5 MG tablet Take 1 tablet (5 mg total) by mouth daily. 90 tablet 3   triamcinolone cream (KENALOG) 0.1 % Apply 1 Application topically 2 (two) times daily. (Patient not taking: Reported on 09/03/2022) 30 g 0   albuterol (VENTOLIN HFA) 108 (90 Base) MCG/ACT inhaler Inhale 2 puffs into the lungs every 6 (six) hours as needed for wheezing or shortness of breath (Cough). (Patient not taking: Reported on 06/20/2022) 18 g 0   cetirizine (ZYRTEC) 10 MG tablet Take 10 mg by mouth daily as needed for allergies (only in allergy season). (Patient not taking: Reported on 09/03/2022)     Difluprednate 0.05 % EMUL Place 1 drop into the left eye 4 (four) times daily. (Patient not taking: Reported on 09/03/2022)     gatifloxacin (ZYMAXID) 0.5 % SOLN Place 1 drop into the left eye 4 (four) times daily. (Patient not taking: Reported on  09/03/2022)     ketorolac (ACULAR) 0.5 % ophthalmic solution Place 1 drop into the left eye 4 (four) times daily. (Patient not taking: Reported on 09/03/2022)     losartan (COZAAR) 25 MG tablet Take 25 mg by mouth daily. (Patient not taking: Reported on 09/03/2022)     No facility-administered medications prior to visit.    Family History  Problem Relation Age of Onset   Hypertension Mother    Diabetes Father    Hypertension Father     Social History   Socioeconomic History   Marital status: Married    Spouse name: Not on file   Number of children: 2   Years of education: 24   Highest education level: Not on file  Occupational History   Occupation: Annuity Specialist  Tobacco Use   Smoking status: Never   Smokeless tobacco: Never  Vaping Use   Vaping Use: Never used  Substance and Sexual Activity   Alcohol use: No   Drug use: No   Sexual activity: Yes  Other Topics Concern   Not on file  Social History Narrative   Born in [place], [place] and raised in [place]. Fun: Travel, go to [employer], watch her daughter play soccer, watch movies   Denies religious beliefs that would effect health care.    Denies abuse and feels safe at home.  Social Determinants of Health   Financial Resource Strain: Not on file  Food Insecurity: Not on file  Transportation Needs: Not on file  Physical Activity: Not on file  Stress: Not on file  Social Connections: Not on file  Intimate Partner Violence: Not on file                                                                                                  Objective:  Physical Exam: BP 136/86 (BP Location: Left Arm, Patient Position: Sitting, Cuff Size: Large)   Pulse 97   Temp (!) 97.5 F (36.4 C) (Temporal)   Wt 227 lb 3.2 oz (103.1 kg)   SpO2 96%   BMI 40.25 kg/m    Wt Readings from Last 3 Encounters:  09/03/22 227 lb 3.2 oz (103.1 kg)  06/20/22 222 lb 9.6 oz (101 kg)  02/21/22 220 lb 9.6 oz (100.1 kg)   BP Readings from Last 3  Encounters:  09/03/22 136/86  06/20/22 128/82  02/21/22 128/84      Physical Exam Constitutional:      General: She is not in acute distress.    Appearance: Normal appearance. She is not ill-appearing or toxic-appearing.  HENT:     Head: Normocephalic and atraumatic.     Nose: Nose normal. No congestion.  Eyes:     General: No scleral icterus.    Extraocular Movements: Extraocular movements intact.  Cardiovascular:     Rate and Rhythm: Normal rate and regular rhythm.     Pulses: Normal pulses.     Heart sounds: Normal heart sounds.  Pulmonary:     Effort: Pulmonary effort is normal. No respiratory distress.     Breath sounds: Normal breath sounds.  Abdominal:     General: Abdomen is flat. Bowel sounds are normal.     Palpations: Abdomen is soft.  Musculoskeletal:        General: Normal range of motion.     Right lower leg: Edema (ankles) present.     Left lower leg: Edema (ankles) present.  Lymphadenopathy:     Cervical: No cervical adenopathy.  Skin:    General: Skin is warm and dry.     Findings: No rash.  Neurological:     General: No focal deficit present.     Mental Status: She is alert and oriented to person, place, and time. Mental status is at baseline.  Psychiatric:        Mood and Affect: Mood normal.        Behavior: Behavior normal.        Thought Content: Thought content normal.        Judgment: Judgment normal.     No results found.  Recent Results (from the past 2160 hour(s))  POCT glycosylated hemoglobin (Hb A1C)     Status: Abnormal   Collection Time: 06/20/22  2:02 PM  Result Value Ref Range   Hemoglobin A1C 5.8 (A) 4.0 - 5.6 %   HbA1c POC (<> result, manual entry)     HbA1c, POC (prediabetic range)     HbA1c, POC (controlled  diabetic range)    TSH     Status: None   Collection Time: 06/20/22  2:08 PM  Result Value Ref Range   TSH 2.08 0.35 - 5.50 uIU/mL  Lipid panel     Status: Abnormal   Collection Time: 06/20/22  2:08 PM  Result  Value Ref Range   Cholesterol 221 (H) 0 - 200 mg/dL    Comment: ATP III Classification       Desirable:  < 200 mg/dL               Borderline High:  200 - 239 mg/dL          High:  > = 409 mg/dL   Triglycerides 811.9 (H) 0.0 - 149.0 mg/dL    Comment: Normal:  <147 mg/dLBorderline High:  150 - 199 mg/dL   HDL 82.95 (L) >62.13 mg/dL   VLDL 08.6 (H) 0.0 - 57.8 mg/dL   Total CHOL/HDL Ratio 6     Comment:                Men          Women1/2 Average Risk     3.4          3.3Average Risk          5.0          4.42X Average Risk          9.6          7.13X Average Risk          15.0          11.0                       NonHDL 183.36     Comment: NOTE:  Non-HDL goal should be 30 mg/dL higher than patient's LDL goal (i.e. LDL goal of < 70 mg/dL, would have non-HDL goal of < 100 mg/dL)  Microalbumin / creatinine urine ratio     Status: Abnormal   Collection Time: 06/20/22  2:08 PM  Result Value Ref Range   Microalb, Ur 2.2 (H) 0.0 - 1.9 mg/dL   Creatinine,U 469.6 mg/dL   Microalb Creat Ratio 1.3 0.0 - 30.0 mg/g  Urinalysis, Routine w reflex microscopic     Status: Abnormal   Collection Time: 06/20/22  2:08 PM  Result Value Ref Range   Color, Urine YELLOW Yellow;Lt. Yellow;Straw;Dark Yellow;Amber;Green;Red;Brown   APPearance CLEAR Clear;Turbid;Slightly Cloudy;Cloudy   Specific Gravity, Urine 1.025 1.000 - 1.030   pH 6.0 5.0 - 8.0   Total Protein, Urine NEGATIVE Negative   Urine Glucose NEGATIVE Negative   Ketones, ur NEGATIVE Negative   Bilirubin Urine NEGATIVE Negative   Hgb urine dipstick NEGATIVE Negative   Urobilinogen, UA 0.2 0.0 - 1.0   Leukocytes,Ua NEGATIVE Negative   Nitrite NEGATIVE Negative   WBC, UA 0-2/hpf 0-2/hpf   RBC / HPF 0-2/hpf 0-2/hpf   Mucus, UA Presence of (A) None   Squamous Epithelial / HPF Few(5-10/hpf) (A) Rare(0-4/hpf)  CBC with Differential/Platelet     Status: None   Collection Time: 06/20/22  2:08 PM  Result Value Ref Range   WBC 5.1 4.0 - 10.5 K/uL   RBC  4.95 3.87 - 5.11 Mil/uL   Hemoglobin 13.3 12.0 - 15.0 g/dL   HCT 29.5 28.4 - 13.2 %   MCV 84.1 78.0 - 100.0 fl   MCHC 31.9 30.0 - 36.0 g/dL   RDW 44.0 10.2 - 72.5 %  Platelets 386.0 150.0 - 400.0 K/uL   Neutrophils Relative % 46.5 43.0 - 77.0 %   Lymphocytes Relative 41.0 12.0 - 46.0 %   Monocytes Relative 8.3 3.0 - 12.0 %   Eosinophils Relative 3.7 0.0 - 5.0 %   Basophils Relative 0.5 0.0 - 3.0 %   Neutro Abs 2.4 1.4 - 7.7 K/uL   Lymphs Abs 2.1 0.7 - 4.0 K/uL   Monocytes Absolute 0.4 0.1 - 1.0 K/uL   Eosinophils Absolute 0.2 0.0 - 0.7 K/uL   Basophils Absolute 0.0 0.0 - 0.1 K/uL  Comprehensive metabolic panel     Status: Abnormal   Collection Time: 06/20/22  2:08 PM  Result Value Ref Range   Sodium 138 135 - 145 mEq/L   Potassium 4.2 3.5 - 5.1 mEq/L   Chloride 99 96 - 112 mEq/L   CO2 29 19 - 32 mEq/L   Glucose, Bld 96 70 - 99 mg/dL   BUN 14 6 - 23 mg/dL   Creatinine, Ser 1.61 (H) 0.40 - 1.20 mg/dL   Total Bilirubin 0.4 0.2 - 1.2 mg/dL   Alkaline Phosphatase 80 39 - 117 U/L   AST 14 0 - 37 U/L   ALT 10 0 - 35 U/L   Total Protein 7.8 6.0 - 8.3 g/dL   Albumin 4.2 3.5 - 5.2 g/dL   GFR 09.60 (L) >45.40 mL/min    Comment: Calculated using the CKD-EPI Creatinine Equation (2021)   Calcium 9.7 8.4 - 10.5 mg/dL  LDL cholesterol, direct     Status: None   Collection Time: 06/20/22  2:08 PM  Result Value Ref Range   Direct LDL 113.0 mg/dL    Comment: Optimal:  <981 mg/dLNear or Above Optimal:  100-129 mg/dLBorderline High:  130-159 mg/dLHigh:  160-189 mg/dLVery High:  >190 mg/dL        Garner Nash, MD, MS

## 2022-09-03 NOTE — Assessment & Plan Note (Signed)
Patient experiences chronic ankle swelling likely due to venous insufficiency, possibly aggravated by medications for hypertension.  Differential diagnosis:  Chronic venous insufficiency Drug-induced edema (aka amlodipine Cardiac insufficiency (unlikely based on negative history and unremarkable PE)   Plan:  Compression stockings: Recommended to alleviate swelling. Monitor and evaluate leg swelling: Ensure no worsening due to medication adjustments. Consider spironolactone if worsening: For additional diuretic effect and reduction of edema.

## 2022-09-03 NOTE — Assessment & Plan Note (Signed)
Current lipid panel shows elevated cholesterol and triglycerides. The patient is on rosuvastatin 20 mg daily without adverse effects.=  Plan:  Continue rosuvastatin 20 mg daily Fasting lipid panel  Dietary counseling: Focus on reducing saturated fats and increasing fiber intake.

## 2022-09-03 NOTE — Assessment & Plan Note (Signed)
Patient's blood pressure is currently 136/86 still elevated. Patient reports long-standing ankle swelling and takes amlodipine 5 mg, losartan 100 mg/hydrochlorothiazide 25 mg.  Plan: Increase amlodipine to 10 mg daily Monitor blood pressure at home and for ankle swelling To avoid frequent clinic visits pre-surgery. Re-evaluate in three months: Check blood pressure, comprehensive metabolic panel, and lipid panel. Lifestyle Modifications: Continue promoting exercise, dietary changes, and weight management.

## 2022-09-10 ENCOUNTER — Other Ambulatory Visit (INDEPENDENT_AMBULATORY_CARE_PROVIDER_SITE_OTHER): Payer: Federal, State, Local not specified - PPO

## 2022-09-10 DIAGNOSIS — N1831 Chronic kidney disease, stage 3a: Secondary | ICD-10-CM

## 2022-09-10 DIAGNOSIS — I152 Hypertension secondary to endocrine disorders: Secondary | ICD-10-CM | POA: Diagnosis not present

## 2022-09-10 DIAGNOSIS — E785 Hyperlipidemia, unspecified: Secondary | ICD-10-CM

## 2022-09-10 DIAGNOSIS — E1159 Type 2 diabetes mellitus with other circulatory complications: Secondary | ICD-10-CM

## 2022-09-10 DIAGNOSIS — E1169 Type 2 diabetes mellitus with other specified complication: Secondary | ICD-10-CM | POA: Diagnosis not present

## 2022-09-10 DIAGNOSIS — E1122 Type 2 diabetes mellitus with diabetic chronic kidney disease: Secondary | ICD-10-CM

## 2022-09-10 LAB — LIPID PANEL
Cholesterol: 123 mg/dL (ref 0–200)
HDL: 34.8 mg/dL — ABNORMAL LOW (ref >39.00)
NonHDL: 88.3
Total CHOL/HDL Ratio: 4
Triglycerides: 226 mg/dL — ABNORMAL HIGH (ref 0.0–149.0)
VLDL: 45.2 mg/dL — ABNORMAL HIGH (ref 0.0–40.0)

## 2022-09-10 LAB — LDL CHOLESTEROL, DIRECT: Direct LDL: 61 mg/dL

## 2022-09-10 LAB — COMPREHENSIVE METABOLIC PANEL
ALT: 12 U/L (ref 0–35)
AST: 15 U/L (ref 0–37)
Albumin: 4.3 g/dL (ref 3.5–5.2)
Alkaline Phosphatase: 70 U/L (ref 39–117)
BUN: 19 mg/dL (ref 6–23)
CO2: 30 mEq/L (ref 19–32)
Calcium: 9.5 mg/dL (ref 8.4–10.5)
Chloride: 102 mEq/L (ref 96–112)
Creatinine, Ser: 1.35 mg/dL — ABNORMAL HIGH (ref 0.40–1.20)
GFR: 43.89 mL/min — ABNORMAL LOW (ref >60.00)
Glucose, Bld: 107 mg/dL — ABNORMAL HIGH (ref 70–99)
Potassium: 4.3 mEq/L (ref 3.5–5.1)
Sodium: 141 mEq/L (ref 135–145)
Total Bilirubin: 0.3 mg/dL (ref 0.2–1.2)
Total Protein: 7.4 g/dL (ref 6.0–8.3)

## 2022-09-10 LAB — BASIC METABOLIC PANEL
BUN: 19 mg/dL (ref 6–23)
CO2: 30 mEq/L (ref 19–32)
Calcium: 9.5 mg/dL (ref 8.4–10.5)
Chloride: 102 mEq/L (ref 96–112)
Creatinine, Ser: 1.35 mg/dL — ABNORMAL HIGH (ref 0.40–1.20)
GFR: 43.89 mL/min — ABNORMAL LOW (ref >60.00)
Glucose, Bld: 107 mg/dL — ABNORMAL HIGH (ref 70–99)
Potassium: 4.3 mEq/L (ref 3.5–5.1)
Sodium: 141 mEq/L (ref 135–145)

## 2022-09-10 LAB — HEMOGLOBIN A1C: Hgb A1c MFr Bld: 6.6 % — ABNORMAL HIGH (ref 4.6–6.5)

## 2022-09-13 DIAGNOSIS — H25812 Combined forms of age-related cataract, left eye: Secondary | ICD-10-CM | POA: Diagnosis not present

## 2022-09-13 DIAGNOSIS — H2511 Age-related nuclear cataract, right eye: Secondary | ICD-10-CM | POA: Diagnosis not present

## 2022-09-13 DIAGNOSIS — H2512 Age-related nuclear cataract, left eye: Secondary | ICD-10-CM | POA: Diagnosis not present

## 2022-09-30 ENCOUNTER — Other Ambulatory Visit: Payer: Self-pay | Admitting: Family Medicine

## 2022-09-30 DIAGNOSIS — H2511 Age-related nuclear cataract, right eye: Secondary | ICD-10-CM | POA: Diagnosis not present

## 2022-09-30 DIAGNOSIS — H25811 Combined forms of age-related cataract, right eye: Secondary | ICD-10-CM | POA: Diagnosis not present

## 2022-09-30 DIAGNOSIS — L308 Other specified dermatitis: Secondary | ICD-10-CM

## 2022-11-04 ENCOUNTER — Other Ambulatory Visit: Payer: Self-pay | Admitting: Family Medicine

## 2022-11-04 DIAGNOSIS — I10 Essential (primary) hypertension: Secondary | ICD-10-CM

## 2022-11-04 NOTE — Telephone Encounter (Signed)
Chart supports rx. Last OV: 09/03/2022 Next OV: 12/20/2022

## 2022-11-14 ENCOUNTER — Other Ambulatory Visit: Payer: Self-pay | Admitting: Family Medicine

## 2022-11-14 DIAGNOSIS — I10 Essential (primary) hypertension: Secondary | ICD-10-CM

## 2022-12-20 ENCOUNTER — Ambulatory Visit: Payer: Federal, State, Local not specified - PPO | Admitting: Family Medicine

## 2022-12-30 ENCOUNTER — Encounter: Payer: Self-pay | Admitting: Family Medicine

## 2022-12-30 ENCOUNTER — Ambulatory Visit: Payer: Federal, State, Local not specified - PPO | Admitting: Family Medicine

## 2022-12-30 VITALS — BP 128/82 | HR 58 | Temp 97.8°F | Wt 223.4 lb

## 2022-12-30 DIAGNOSIS — E876 Hypokalemia: Secondary | ICD-10-CM | POA: Insufficient documentation

## 2022-12-30 DIAGNOSIS — E1159 Type 2 diabetes mellitus with other circulatory complications: Secondary | ICD-10-CM

## 2022-12-30 DIAGNOSIS — E1122 Type 2 diabetes mellitus with diabetic chronic kidney disease: Secondary | ICD-10-CM

## 2022-12-30 DIAGNOSIS — N1831 Chronic kidney disease, stage 3a: Secondary | ICD-10-CM

## 2022-12-30 DIAGNOSIS — I152 Hypertension secondary to endocrine disorders: Secondary | ICD-10-CM

## 2022-12-30 DIAGNOSIS — E559 Vitamin D deficiency, unspecified: Secondary | ICD-10-CM

## 2022-12-30 DIAGNOSIS — E785 Hyperlipidemia, unspecified: Secondary | ICD-10-CM

## 2022-12-30 DIAGNOSIS — L608 Other nail disorders: Secondary | ICD-10-CM

## 2022-12-30 DIAGNOSIS — E1169 Type 2 diabetes mellitus with other specified complication: Secondary | ICD-10-CM

## 2022-12-30 DIAGNOSIS — M25472 Effusion, left ankle: Secondary | ICD-10-CM

## 2022-12-30 LAB — POCT GLYCOSYLATED HEMOGLOBIN (HGB A1C): Hemoglobin A1C: 6.3 % — AB (ref 4.0–5.6)

## 2022-12-30 NOTE — Progress Notes (Signed)
Assessment/Plan:   Problem List Items Addressed This Visit       Cardiovascular and Mediastinum   Hypertension associated with diabetes (HCC)    Blood pressure well-controlled at 128/82 mmHg. No adverse effects from medications. Plan: Continue current medications (Amlodipine, Losartan/HCTZ).        Endocrine   Hyperlipidemia associated with type 2 diabetes mellitus (HCC)   Type 2 diabetes mellitus with stage 3a chronic kidney disease, without long-term current use of insulin (HCC) - Primary    Current A1c: 6.3%, improved from 6.6%. Excellent control through lifestyle management. Plan: Continue current dietary and exercise regimen.      Relevant Orders   POCT glycosylated hemoglobin (Hb A1C) (Completed)     Other   Vitamin D deficiency   Ankle swelling    Mild left ankle swelling, improved with conservative measures. Plan: Continue use of compression socks, elevate legs, monitor for progression.      Discoloration and thickening of nails both feet   Hypokalemia    Potassium stable at 4.2-4.3 mmol/L. Plan: Discontinue over-the-counter potassium supplements, continue monitoring levels.       Medications Discontinued During This Encounter  Medication Reason   amoxicillin (AMOXIL) 500 MG capsule    POTASSIUM PO    triamcinolone cream (KENALOG) 0.1 %     Return in about 6 months (around 06/29/2023) for fasting labs 1 week before, DM, BP, HLD.    Subjective:   Encounter date: 12/30/2022  Bailey Ward is a 57 y.o. female who has Hypertension associated with diabetes (HCC); Hair loss; Encounter for general adult medical examination with abnormal findings; Obesity; Vitamin D deficiency; Chronic pain of left knee; Idiopathic chronic venous hypertension of both lower extremities with inflammation; S/P hysterectomy; Hyperpigmentation; Eczema; Hyperlipidemia associated with type 2 diabetes mellitus (HCC); Type 2 diabetes mellitus with stage 3a chronic kidney  disease, without long-term current use of insulin (HCC); Ankle swelling; Discoloration and thickening of nails both feet; and Hypokalemia on their problem list..   She  has a past medical history of Allergy and Hypertension..   Chief Complaint: Follow-up for diabetes management, blood pressure monitoring, and potassium query.  History of Present Illness: Diabetes. Patient reports good blood sugar control with lifestyle management. Hemoglobin A1c has decreased from 6.6% to 6.3%. No new symptoms reported related to diabetes.  Hypertension. Patient's blood pressure today was 128/82 mmHg, which has improved since the last visit after medication adjustments. No adverse effects reported from current antihypertensive regimen.  Potassium. Patient has been on over-the-counter potassium supplements due to previous deficiency. Recent lab work shows stable potassium levels (4.2-4.3 mmol/L). Discussing whether to continue or discontinue supplementation.  Edema. Reports some improvement in left ankle swelling with compression socks and increased activity. No significant progression noted.  Vision. Patient had cataract surgery recently, which resolved previous blurry vision issues.   Review of Systems  Constitutional:  Negative for chills, diaphoresis, fever, malaise/fatigue and weight loss.  HENT:  Negative for congestion, ear discharge, ear pain and hearing loss.   Eyes:  Negative for blurred vision (resolved after cataract surgery), double vision, photophobia, pain, discharge and redness.  Respiratory:  Negative for cough, sputum production, shortness of breath and wheezing.   Cardiovascular:  Positive for leg swelling (left ankle, improved). Negative for chest pain and palpitations.  Gastrointestinal:  Negative for abdominal pain, blood in stool, constipation, diarrhea, heartburn, melena, nausea and vomiting.  Genitourinary:  Negative for dysuria, flank pain, frequency, hematuria and urgency.   Musculoskeletal:  Negative  for myalgias.  Skin:  Negative for itching and rash.  Neurological:  Negative for dizziness, tingling, tremors, speech change, seizures, loss of consciousness, weakness and headaches.  Endo/Heme/Allergies:  Negative for polydipsia.  Psychiatric/Behavioral:  Negative for depression, hallucinations, memory loss, substance abuse and suicidal ideas. The patient does not have insomnia.   All other systems reviewed and are negative.   Past Surgical History:  Procedure Laterality Date   ABDOMINAL HYSTERECTOMY      Outpatient Medications Prior to Visit  Medication Sig Dispense Refill   amLODipine (NORVASC) 10 MG tablet Take 1 tablet (10 mg total) by mouth daily. 90 tablet 3   losartan-hydrochlorothiazide (HYZAAR) 100-25 MG tablet TAKE 1 TABLET BY MOUTH EVERY DAY 90 tablet 3   rosuvastatin (CRESTOR) 20 MG tablet Take 1 tablet (20 mg total) by mouth daily. 90 tablet 3   POTASSIUM PO Take by mouth.     amoxicillin (AMOXIL) 500 MG capsule Take by mouth 3 (three) times daily. (Patient not taking: Reported on 12/30/2022)     triamcinolone cream (KENALOG) 0.1 % APPLY TO AFFECTED AREA TWICE A DAY (Patient not taking: Reported on 12/30/2022) 30 g 0   No facility-administered medications prior to visit.    Family History  Problem Relation Age of Onset   Hypertension Mother    Diabetes Father    Hypertension Father     Social History   Socioeconomic History   Marital status: Married    Spouse name: Not on file   Number of children: 2   Years of education: 66   Highest education level: Not on file  Occupational History   Occupation: Annuity Specialist  Tobacco Use   Smoking status: Never   Smokeless tobacco: Never  Vaping Use   Vaping status: Never Used  Substance and Sexual Activity   Alcohol use: No   Drug use: No   Sexual activity: Yes  Other Topics Concern   Not on file  Social History Narrative   Born in [place], [place] and raised in [place]. Fun: Travel, go to  [employer], watch her daughter play soccer, watch movies   Denies religious beliefs that would effect health care.    Denies abuse and feels safe at home.    Social Determinants of Health   Financial Resource Strain: Not on file  Food Insecurity: Not on file  Transportation Needs: Not on file  Physical Activity: Not on file  Stress: Not on file  Social Connections: Not on file  Intimate Partner Violence: Not on file                                                                                                  Objective:  Physical Exam: BP 128/82 (BP Location: Left Arm, Patient Position: Sitting, Cuff Size: Large)   Pulse (!) 58   Temp 97.8 F (36.6 C) (Temporal)   Wt 223 lb 6.4 oz (101.3 kg)   SpO2 98%   BMI 39.57 kg/m   Wt Readings from Last 3 Encounters:  12/30/22 223 lb 6.4 oz (101.3 kg)  09/03/22 227 lb 3.2 oz (103.1  kg)  06/20/22 222 lb 9.6 oz (101 kg)     Physical Exam Constitutional:      General: She is not in acute distress.    Appearance: Normal appearance. She is not ill-appearing or toxic-appearing.  HENT:     Head: Normocephalic and atraumatic.     Nose: Nose normal. No congestion.  Eyes:     General: No scleral icterus.    Extraocular Movements: Extraocular movements intact.  Cardiovascular:     Rate and Rhythm: Normal rate and regular rhythm.     Pulses: Normal pulses.     Heart sounds: Normal heart sounds.  Pulmonary:     Effort: Pulmonary effort is normal. No respiratory distress.     Breath sounds: Normal breath sounds.  Abdominal:     General: Abdomen is flat. Bowel sounds are normal.     Palpations: Abdomen is soft.  Musculoskeletal:        General: Normal range of motion.     Right lower leg: No edema.     Left lower leg: No edema.  Lymphadenopathy:     Cervical: No cervical adenopathy.  Skin:    General: Skin is warm and dry.     Findings: No rash.  Neurological:     General: No focal deficit present.     Mental Status: She is  alert and oriented to person, place, and time. Mental status is at baseline.  Psychiatric:        Mood and Affect: Mood normal.        Behavior: Behavior normal.        Thought Content: Thought content normal.        Judgment: Judgment normal.    Diabetic Foot Exam - Simple   Simple Foot Form Diabetic Foot exam was performed with the following findings: Yes 12/30/2022  8:36 AM  Visual Inspection Sensation Testing Intact to touch and monofilament testing bilaterally: Yes Pulse Check Posterior Tibialis and Dorsalis pulse intact bilaterally: Yes Comments Nail thickening bilateral      No results found.  Recent Results (from the past 2160 hour(s))  POCT glycosylated hemoglobin (Hb A1C)     Status: Abnormal   Collection Time: 12/30/22  8:50 AM  Result Value Ref Range   Hemoglobin A1C 6.3 (A) 4.0 - 5.6 %   HbA1c POC (<> result, manual entry)     HbA1c, POC (prediabetic range)     HbA1c, POC (controlled diabetic range)          Garner Nash, MD, MS

## 2022-12-30 NOTE — Assessment & Plan Note (Signed)
Mild left ankle swelling, improved with conservative measures. Plan: Continue use of compression socks, elevate legs, monitor for progression.

## 2022-12-30 NOTE — Assessment & Plan Note (Signed)
Potassium stable at 4.2-4.3 mmol/L. Plan: Discontinue over-the-counter potassium supplements, continue monitoring levels.

## 2022-12-30 NOTE — Assessment & Plan Note (Signed)
Blood pressure well-controlled at 128/82 mmHg. No adverse effects from medications. Plan: Continue current medications (Amlodipine, Losartan/HCTZ).

## 2022-12-30 NOTE — Assessment & Plan Note (Signed)
Current A1c: 6.3%, improved from 6.6%. Excellent control through lifestyle management. Plan: Continue current dietary and exercise regimen.

## 2023-02-14 DIAGNOSIS — H40013 Open angle with borderline findings, low risk, bilateral: Secondary | ICD-10-CM | POA: Diagnosis not present

## 2023-06-06 ENCOUNTER — Ambulatory Visit: Payer: Federal, State, Local not specified - PPO | Admitting: Family Medicine

## 2023-06-06 DIAGNOSIS — E1122 Type 2 diabetes mellitus with diabetic chronic kidney disease: Secondary | ICD-10-CM

## 2023-06-20 ENCOUNTER — Ambulatory Visit: Payer: Federal, State, Local not specified - PPO | Admitting: Family Medicine

## 2023-06-27 ENCOUNTER — Other Ambulatory Visit: Payer: Federal, State, Local not specified - PPO

## 2023-06-30 ENCOUNTER — Other Ambulatory Visit: Payer: Self-pay | Admitting: Family Medicine

## 2023-06-30 ENCOUNTER — Other Ambulatory Visit (INDEPENDENT_AMBULATORY_CARE_PROVIDER_SITE_OTHER)

## 2023-06-30 ENCOUNTER — Telehealth: Payer: Self-pay

## 2023-06-30 DIAGNOSIS — N1831 Chronic kidney disease, stage 3a: Secondary | ICD-10-CM | POA: Diagnosis not present

## 2023-06-30 DIAGNOSIS — E66812 Obesity, class 2: Secondary | ICD-10-CM

## 2023-06-30 DIAGNOSIS — E559 Vitamin D deficiency, unspecified: Secondary | ICD-10-CM | POA: Diagnosis not present

## 2023-06-30 DIAGNOSIS — E1122 Type 2 diabetes mellitus with diabetic chronic kidney disease: Secondary | ICD-10-CM | POA: Diagnosis not present

## 2023-06-30 DIAGNOSIS — E1169 Type 2 diabetes mellitus with other specified complication: Secondary | ICD-10-CM

## 2023-06-30 DIAGNOSIS — Z6838 Body mass index (BMI) 38.0-38.9, adult: Secondary | ICD-10-CM

## 2023-06-30 LAB — CBC WITH DIFFERENTIAL/PLATELET
Basophils Absolute: 0 10*3/uL (ref 0.0–0.1)
Basophils Relative: 0.3 % (ref 0.0–3.0)
Eosinophils Absolute: 0.2 10*3/uL (ref 0.0–0.7)
Eosinophils Relative: 4.3 % (ref 0.0–5.0)
HCT: 37.9 % (ref 36.0–46.0)
Hemoglobin: 12.3 g/dL (ref 12.0–15.0)
Lymphocytes Relative: 33.9 % (ref 12.0–46.0)
Lymphs Abs: 1.6 10*3/uL (ref 0.7–4.0)
MCHC: 32.5 g/dL (ref 30.0–36.0)
MCV: 83.8 fl (ref 78.0–100.0)
Monocytes Absolute: 0.4 10*3/uL (ref 0.1–1.0)
Monocytes Relative: 7.5 % (ref 3.0–12.0)
Neutro Abs: 2.6 10*3/uL (ref 1.4–7.7)
Neutrophils Relative %: 54 % (ref 43.0–77.0)
Platelets: 334 10*3/uL (ref 150.0–400.0)
RBC: 4.52 Mil/uL (ref 3.87–5.11)
RDW: 15.2 % (ref 11.5–15.5)
WBC: 4.7 10*3/uL (ref 4.0–10.5)

## 2023-06-30 LAB — COMPREHENSIVE METABOLIC PANEL
ALT: 11 U/L (ref 0–35)
AST: 17 U/L (ref 0–37)
Albumin: 4.4 g/dL (ref 3.5–5.2)
Alkaline Phosphatase: 78 U/L (ref 39–117)
BUN: 17 mg/dL (ref 6–23)
CO2: 31 meq/L (ref 19–32)
Calcium: 9.9 mg/dL (ref 8.4–10.5)
Chloride: 96 meq/L (ref 96–112)
Creatinine, Ser: 1.36 mg/dL — ABNORMAL HIGH (ref 0.40–1.20)
GFR: 43.25 mL/min — ABNORMAL LOW (ref >60.00)
Glucose, Bld: 173 mg/dL — ABNORMAL HIGH (ref 70–99)
Potassium: 3.3 meq/L — ABNORMAL LOW (ref 3.5–5.1)
Sodium: 137 meq/L (ref 135–145)
Total Bilirubin: 0.4 mg/dL (ref 0.2–1.2)
Total Protein: 7.8 g/dL (ref 6.0–8.3)

## 2023-06-30 LAB — LIPID PANEL
Cholesterol: 113 mg/dL (ref 0–200)
HDL: 32.9 mg/dL — ABNORMAL LOW (ref >39.00)
LDL Cholesterol: 21 mg/dL (ref 0–99)
NonHDL: 80.13
Total CHOL/HDL Ratio: 3
Triglycerides: 294 mg/dL — ABNORMAL HIGH (ref 0.0–149.0)
VLDL: 58.8 mg/dL — ABNORMAL HIGH (ref 0.0–40.0)

## 2023-06-30 LAB — HEMOGLOBIN A1C: Hgb A1c MFr Bld: 6.9 % — ABNORMAL HIGH (ref 4.6–6.5)

## 2023-06-30 LAB — VITAMIN D 25 HYDROXY (VIT D DEFICIENCY, FRACTURES): VITD: 16.28 ng/mL — ABNORMAL LOW (ref 30.00–100.00)

## 2023-06-30 NOTE — Telephone Encounter (Signed)
 This thyroid panel with TSH is ran through [facility] and her labs this morning was sent to [facility]. Patient would have to return to office.

## 2023-06-30 NOTE — Addendum Note (Signed)
 Addended by: Trudee Kuster on: 06/30/2023 09:07 AM   Modules accepted: Orders

## 2023-06-30 NOTE — Addendum Note (Signed)
 Addended by: Trudee Kuster on: 06/30/2023 09:32 AM   Modules accepted: Orders

## 2023-06-30 NOTE — Addendum Note (Signed)
 Addended by: Zada Girt D on: 06/30/2023 11:43 AM   Modules accepted: Orders

## 2023-06-30 NOTE — Addendum Note (Signed)
 Addended by: Zada Girt D on: 06/30/2023 10:00 AM   Modules accepted: Orders

## 2023-06-30 NOTE — Telephone Encounter (Signed)
-----   Message from Garnette Gunner sent at 06/30/2023  4:33 PM EDT ----- Can we add this onto labs?

## 2023-07-02 LAB — URINALYSIS W MICROSCOPIC + REFLEX CULTURE
Bilirubin Urine: NEGATIVE
Glucose, UA: NEGATIVE
Hgb urine dipstick: NEGATIVE
Ketones, ur: NEGATIVE
Nitrites, Initial: NEGATIVE
RBC / HPF: NONE SEEN /HPF (ref 0–2)
Specific Gravity, Urine: 1.017 (ref 1.001–1.035)
pH: 6.5 (ref 5.0–8.0)

## 2023-07-02 LAB — URINE CULTURE
MICRO NUMBER:: 16243488
Result:: NO GROWTH
SPECIMEN QUALITY:: ADEQUATE

## 2023-07-02 LAB — CULTURE INDICATED

## 2023-07-03 ENCOUNTER — Other Ambulatory Visit: Payer: Self-pay | Admitting: Family Medicine

## 2023-07-03 DIAGNOSIS — E559 Vitamin D deficiency, unspecified: Secondary | ICD-10-CM

## 2023-07-03 DIAGNOSIS — E1121 Type 2 diabetes mellitus with diabetic nephropathy: Secondary | ICD-10-CM

## 2023-07-03 DIAGNOSIS — N1831 Chronic kidney disease, stage 3a: Secondary | ICD-10-CM

## 2023-07-03 DIAGNOSIS — E876 Hypokalemia: Secondary | ICD-10-CM

## 2023-07-03 MED ORDER — VITAMIN D (ERGOCALCIFEROL) 1.25 MG (50000 UNIT) PO CAPS: 50000.0000 [IU] | ORAL_CAPSULE | ORAL | 0 refills | Status: DC

## 2023-07-03 MED ORDER — DAPAGLIFLOZIN PROPANEDIOL 5 MG PO TABS: 5.0000 mg | ORAL_TABLET | Freq: Every day | ORAL | 0 refills | Status: DC

## 2023-07-04 ENCOUNTER — Ambulatory Visit: Payer: Federal, State, Local not specified - PPO | Admitting: Family Medicine

## 2023-07-04 ENCOUNTER — Encounter: Payer: Self-pay | Admitting: Family Medicine

## 2023-07-04 DIAGNOSIS — E66812 Obesity, class 2: Secondary | ICD-10-CM | POA: Diagnosis not present

## 2023-07-04 DIAGNOSIS — E876 Hypokalemia: Secondary | ICD-10-CM | POA: Diagnosis not present

## 2023-07-04 DIAGNOSIS — Z6838 Body mass index (BMI) 38.0-38.9, adult: Secondary | ICD-10-CM

## 2023-07-04 DIAGNOSIS — E1122 Type 2 diabetes mellitus with diabetic chronic kidney disease: Secondary | ICD-10-CM | POA: Diagnosis not present

## 2023-07-04 DIAGNOSIS — N1831 Chronic kidney disease, stage 3a: Secondary | ICD-10-CM

## 2023-07-04 NOTE — Progress Notes (Signed)
 Assessment/Plan:    Assessment & Plan Chronic Kidney Disease Stage 3 Chronic kidney disease with GFR at 40 and proteinuria indicating worsening kidney function. Low potassium and very low vitamin D suggest possible secondary hyperparathyroidism. She is at risk for further kidney damage. Bailey Ward (dapagliflozin) is recommended for renal and cardiovascular benefits, with a potential risk of urinary tract infections, though not a significant concern given her history. - Start Farxiga (dapagliflozin) for kidney protection - Recheck lab work in one week, including parathyroid hormone, potassium levels, and thyroid hormone - Refer to nephrology for kidney monitoring - Provide potassium supplementation: 20 mg twice a day for three days - Start vitamin D supplementation: 50,000 IU once a week for eight weeks  Type 2 Diabetes Mellitus Diabetes is well controlled with A1c at 6.9%. She reports dietary indiscretions but maintains control through lifestyle modifications. No current need for additional diabetes medication as A1c is below 7%. - Continue monitoring A1c levels - Encourage adherence to a low carb, low salt, low fat, lower calorie Mediterranean style diet - Encourage regular physical activity, including brisk walking and resistance training  Hypertension Hypertension is well controlled with current medications. Blood pressure is 128/84 mmHg. No symptoms such as headaches, blurry vision, chest pain, or shortness of breath. - Continue current medications: amlodipine and losartan hydrochlorothiazide  Hyperlipidemia Cholesterol levels are well controlled. No changes in medication are necessary at this time.  Vitamin D Deficiency Vitamin D levels are very low, potentially contributing to secondary hyperparathyroidism. This is the first noted deficiency. Vitamin D supplementation is necessary to address this deficiency and support kidney function. - Start vitamin D supplementation: 50,000 IU  once a week for eight weeks - Recheck vitamin D levels in eight weeks      There are no discontinued medications.  Return in about 1 week (around 07/11/2023) for fasting labs.    Subjective:   Encounter date: 07/04/2023  Najee Cowens Troy-Richardson is a 58 y.o. female who has Hypertension associated with diabetes (HCC); Hair loss; Encounter for general adult medical examination with abnormal findings; Obesity; Vitamin D deficiency; Chronic pain of left knee; Idiopathic chronic venous hypertension of both lower extremities with inflammation; S/P hysterectomy; Hyperpigmentation; Eczema; Hyperlipidemia associated with type 2 diabetes mellitus (HCC); Type 2 diabetes mellitus with stage 3a chronic kidney disease, without long-term current use of insulin (HCC); Ankle swelling; Discoloration and thickening of nails both feet; and Hypokalemia on their problem list..   She  has a past medical history of Allergy and Hypertension..   She presents with chief complaint of Medical Management of Chronic Issues (6 month follow up) .   Discussed the use of AI scribe software for clinical note transcription with the patient, who gave verbal consent to proceed.  History of Present Illness AMALEA OTTEY is a 58 year old female with hypertension, diabetes, and kidney disease who presents for chronic follow-up and lab work review.  She is here for a follow-up regarding her chronic conditions, including hypertension, diabetes, kidney disease, cholesterol, and vitamin D deficiency. Her blood pressure is well-controlled with her current medications, which include amlodipine and losartan hydrochlorothiazide. No headaches, blurry vision, chest pain, or shortness of breath.  Regarding her diabetes, her diet has been poor since the holidays, with high intake of unhealthy foods such as mac and cheese and desserts. Despite this, her diabetes remains well-controlled with an A1c of 6.9. She is not currently on any  diabetes medication and manages her condition through diet and lifestyle. No  increased thirst or frequent urination.  Her kidney function is a concern, with a stable GFR of 40 but evidence of proteinuria. She has low potassium levels and has not yet completed a follow-up urine test due to scheduling conflicts.  She reports low vitamin D levels. Her cholesterol is well-controlled. In terms of physical activity, her main form of exercise is walking on campus, but she acknowledges the need for more structured physical activity. Her weight has been stable over the past year, with a slight decrease from 227 to 226 pounds.      ROS  Past Surgical History:  Procedure Laterality Date   ABDOMINAL HYSTERECTOMY      Outpatient Medications Prior to Visit  Medication Sig Dispense Refill   amLODipine (NORVASC) 10 MG tablet Take 1 tablet (10 mg total) by mouth daily. 90 tablet 3   dapagliflozin propanediol (FARXIGA) 5 MG TABS tablet Take 1 tablet (5 mg total) by mouth daily before breakfast. 30 tablet 0   losartan-hydrochlorothiazide (HYZAAR) 100-25 MG tablet TAKE 1 TABLET BY MOUTH EVERY DAY 90 tablet 3   rosuvastatin (CRESTOR) 20 MG tablet Take 1 tablet (20 mg total) by mouth daily. 90 tablet 3   Vitamin D, Ergocalciferol, (DRISDOL) 1.25 MG (50000 UNIT) CAPS capsule Take 1 capsule (50,000 Units total) by mouth every 7 (seven) days. 8 capsule 0   No facility-administered medications prior to visit.    Family History  Problem Relation Age of Onset   Hypertension Mother    Diabetes Father    Hypertension Father     Social History   Socioeconomic History   Marital status: Married    Spouse name: Not on file   Number of children: 2   Years of education: 64   Highest education level: Not on file  Occupational History   Occupation: Annuity Specialist  Tobacco Use   Smoking status: Never   Smokeless tobacco: Never  Vaping Use   Vaping status: Never Used  Substance and Sexual Activity    Alcohol use: No   Drug use: No   Sexual activity: Yes  Other Topics Concern   Not on file  Social History Narrative   Born in [place], [place] and raised in [place]. Fun: Travel, go to [employer], watch her daughter play soccer, watch movies   Denies religious beliefs that would effect health care.    Denies abuse and feels safe at home.    Social Drivers of Corporate investment banker Strain: Not on file  Food Insecurity: Not on file  Transportation Needs: Not on file  Physical Activity: Not on file  Stress: Not on file  Social Connections: Not on file  Intimate Partner Violence: Not on file                                                                                                  Objective:  Physical Exam: BP 128/84   Pulse 76   Temp 97.8 F (36.6 C) (Temporal)   Wt 226 lb 6.4 oz (102.7 kg)   SpO2 97%   BMI 40.10 kg/m  Wt Readings from Last 3 Encounters:  07/04/23 226 lb 6.4 oz (102.7 kg)  12/30/22 223 lb 6.4 oz (101.3 kg)  09/03/22 227 lb 3.2 oz (103.1 kg)    Physical Exam VITALS: BP- 128/84 MEASUREMENTS: Weight- 226. GENERAL: Alert, cooperative, well developed, no acute distress HEENT: Normocephalic, normal oropharynx, moist mucous membranes CHEST: Clear to auscultation bilaterally, no wheezes, rhonchi, or crackles CARDIOVASCULAR: Normal heart rate and rhythm, S1 and S2 normal without murmurs ABDOMEN: Soft, non-tender, non-distended, without organomegaly, normal bowel sounds EXTREMITIES: No cyanosis or edema NEUROLOGICAL: Cranial nerves grossly intact, moves all extremities without gross motor or sensory deficit   Physical Exam  No results found.  Recent Results (from the past 2160 hours)  Urinalysis w microscopic + reflex cultur     Status: Abnormal   Collection Time: 06/30/23  8:34 AM   Specimen: Blood  Result Value Ref Range   Color, Urine YELLOW YELLOW   APPearance CLEAR CLEAR   Specific Gravity, Urine 1.017 1.001 - 1.035   pH 6.5 5.0 - 8.0    Glucose, UA NEGATIVE NEGATIVE   Bilirubin Urine NEGATIVE NEGATIVE   Ketones, ur NEGATIVE NEGATIVE   Hgb urine dipstick NEGATIVE NEGATIVE   Protein, ur 1+ (A) NEGATIVE   Nitrites, Initial NEGATIVE NEGATIVE   Leukocyte Esterase TRACE (A) NEGATIVE   WBC, UA 0-5 0 - 5 /HPF   RBC / HPF NONE SEEN 0 - 2 /HPF   Squamous Epithelial / HPF 10-20 (A) < OR = 5 /HPF   Bacteria, UA FEW (A) NONE SEEN /HPF   Hyaline Cast 6-10 (A) NONE SEEN /LPF  Urine Culture     Status: None   Collection Time: 06/30/23  8:34 AM  Result Value Ref Range   MICRO NUMBER: [id number]    SPECIMEN QUALITY: Adequate    Sample Source URINE    STATUS: FINAL    Result: No Growth   REFLEXIVE URINE CULTURE     Status: None   Collection Time: 06/30/23  8:34 AM  Result Value Ref Range   REFLEXIVE URINE CULTURE      Comment: CULTURE INDICATED - RESULTS TO FOLLOW  CBC with Differential/Platelet     Status: None   Collection Time: 06/30/23  9:11 AM  Result Value Ref Range   WBC 4.7 4.0 - 10.5 K/uL   RBC 4.52 3.87 - 5.11 Mil/uL   Hemoglobin 12.3 12.0 - 15.0 g/dL   HCT 30.8 65.7 - 84.6 %   MCV 83.8 78.0 - 100.0 fl   MCHC 32.5 30.0 - 36.0 g/dL   RDW 96.2 95.2 - 84.1 %   Platelets 334.0 150.0 - 400.0 K/uL   Neutrophils Relative % 54.0 43.0 - 77.0 %   Lymphocytes Relative 33.9 12.0 - 46.0 %   Monocytes Relative 7.5 3.0 - 12.0 %   Eosinophils Relative 4.3 0.0 - 5.0 %   Basophils Relative 0.3 0.0 - 3.0 %   Neutro Abs 2.6 1.4 - 7.7 K/uL   Lymphs Abs 1.6 0.7 - 4.0 K/uL   Monocytes Absolute 0.4 0.1 - 1.0 K/uL   Eosinophils Absolute 0.2 0.0 - 0.7 K/uL   Basophils Absolute 0.0 0.0 - 0.1 K/uL  Comprehensive metabolic panel     Status: Abnormal   Collection Time: 06/30/23  9:11 AM  Result Value Ref Range   Sodium 137 135 - 145 mEq/L   Potassium 3.3 (L) 3.5 - 5.1 mEq/L   Chloride 96 96 - 112 mEq/L   CO2 31 19 - 32  mEq/L   Glucose, Bld 173 (H) 70 - 99 mg/dL   BUN 17 6 - 23 mg/dL   Creatinine, Ser 9.60 (H) 0.40 - 1.20 mg/dL    Total Bilirubin 0.4 0.2 - 1.2 mg/dL   Alkaline Phosphatase 78 39 - 117 U/L   AST 17 0 - 37 U/L   ALT 11 0 - 35 U/L   Total Protein 7.8 6.0 - 8.3 g/dL   Albumin 4.4 3.5 - 5.2 g/dL   GFR 45.40 (L) >98.11 mL/min    Comment: Calculated using the CKD-EPI Creatinine Equation (2021)   Calcium 9.9 8.4 - 10.5 mg/dL  Hemoglobin B1Y     Status: Abnormal   Collection Time: 06/30/23  9:11 AM  Result Value Ref Range   Hgb A1c MFr Bld 6.9 (H) 4.6 - 6.5 %    Comment: Glycemic Control Guidelines for People with Diabetes:Non Diabetic:  <6%Goal of Therapy: <7%Additional Action Suggested:  >8%   Lipid Profile     Status: Abnormal   Collection Time: 06/30/23  9:11 AM  Result Value Ref Range   Cholesterol 113 0 - 200 mg/dL    Comment: ATP III Classification       Desirable:  < 200 mg/dL               Borderline High:  200 - 239 mg/dL          High:  > = 782 mg/dL   Triglycerides 956.2 (H) 0.0 - 149.0 mg/dL    Comment: Normal:  <130 mg/dLBorderline High:  150 - 199 mg/dL   HDL 86.57 (L) >84.69 mg/dL   VLDL 62.9 (H) 0.0 - 52.8 mg/dL   LDL Cholesterol 21 0 - 99 mg/dL   Total CHOL/HDL Ratio 3     Comment:                Men          Women1/2 Average Risk     3.4          3.3Average Risk          5.0          4.42X Average Risk          9.6          7.13X Average Risk          15.0          11.0                       NonHDL 80.13     Comment: NOTE:  Non-HDL goal should be 30 mg/dL higher than patient's LDL goal (i.e. LDL goal of < 70 mg/dL, would have non-HDL goal of < 100 mg/dL)  VITAMIN D 25 Hydroxy (Vit-D Deficiency, Fractures)     Status: Abnormal   Collection Time: 06/30/23  9:11 AM  Result Value Ref Range   VITD 16.28 (L) 30.00 - 100.00 ng/mL        Garner Nash, MD, MS

## 2023-07-11 ENCOUNTER — Other Ambulatory Visit

## 2023-07-11 DIAGNOSIS — E1122 Type 2 diabetes mellitus with diabetic chronic kidney disease: Secondary | ICD-10-CM

## 2023-07-11 DIAGNOSIS — N1831 Chronic kidney disease, stage 3a: Secondary | ICD-10-CM | POA: Diagnosis not present

## 2023-07-11 LAB — MICROALBUMIN / CREATININE URINE RATIO
Creatinine,U: 124.2 mg/dL
Microalb Creat Ratio: 15.1 mg/g (ref 0.0–30.0)
Microalb, Ur: 1.9 mg/dL (ref 0.0–1.9)

## 2023-07-11 LAB — BASIC METABOLIC PANEL WITH GFR
BUN: 16 mg/dL (ref 6–23)
CO2: 31 meq/L (ref 19–32)
Calcium: 9.8 mg/dL (ref 8.4–10.5)
Chloride: 98 meq/L (ref 96–112)
Creatinine, Ser: 1.41 mg/dL — ABNORMAL HIGH (ref 0.40–1.20)
GFR: 41.41 mL/min — ABNORMAL LOW (ref >60.00)
Glucose, Bld: 119 mg/dL — ABNORMAL HIGH (ref 70–99)
Potassium: 3.5 meq/L (ref 3.5–5.1)
Sodium: 140 meq/L (ref 135–145)

## 2023-07-11 LAB — PHOSPHORUS: Phosphorus: 3.6 mg/dL (ref 2.3–4.6)

## 2023-07-12 LAB — THYROID PANEL WITH TSH
Free Thyroxine Index: 2.1 (ref 1.4–3.8)
T3 Uptake: 29 % (ref 22–35)
T4, Total: 7.4 ug/dL (ref 5.1–11.9)
TSH: 2.6 m[IU]/L (ref 0.40–4.50)

## 2023-07-12 LAB — PARATHYROID HORMONE, INTACT (NO CA): PTH: 35 pg/mL (ref 16–77)

## 2023-08-04 ENCOUNTER — Other Ambulatory Visit: Payer: Self-pay | Admitting: Family Medicine

## 2023-08-04 DIAGNOSIS — E1121 Type 2 diabetes mellitus with diabetic nephropathy: Secondary | ICD-10-CM

## 2023-08-04 DIAGNOSIS — E1122 Type 2 diabetes mellitus with diabetic chronic kidney disease: Secondary | ICD-10-CM

## 2023-08-04 NOTE — Telephone Encounter (Signed)
 Copied from CRM [id number]. Topic: Clinical - Medication Refill >> Aug 04, 2023  9:42 AM Alyse July wrote: Most Recent Primary Care Visit:  Provider: [facility]  Department: [facility]  Visit Type: LAB VISIT  Date: 07/11/2023  Medication: dapagliflozin  propanediol (FARXIGA ) 5 MG TABS tablet  Has the patient contacted their pharmacy? Yes (Agent: If no, request that the patient contact the pharmacy for the refill. If patient does not wish to contact the pharmacy document the reason why and proceed with request.) (Agent: If yes, when and what did the pharmacy advise?)  Is this the correct pharmacy for this prescription? Yes If no, delete pharmacy and type the correct one.  This is the patient's preferred pharmacy:  [facility] [address] [place] - [address] [place] [address] Phone: [phone] Fax: [phone]   Has the prescription been filled recently? Yes  Is the patient out of the medication? Yes  Has the patient been seen for an appointment in the last year OR does the patient have an upcoming appointment? Yes  Can we respond through MyChart? Yes  Agent: Please be advised that Rx refills may take up to 3 business days. We ask that you follow-up with your pharmacy.

## 2023-08-05 ENCOUNTER — Other Ambulatory Visit: Payer: Self-pay | Admitting: Family Medicine

## 2023-08-05 DIAGNOSIS — E1121 Type 2 diabetes mellitus with diabetic nephropathy: Secondary | ICD-10-CM

## 2023-08-05 DIAGNOSIS — E1122 Type 2 diabetes mellitus with diabetic chronic kidney disease: Secondary | ICD-10-CM

## 2023-08-05 MED ORDER — DAPAGLIFLOZIN PROPANEDIOL 5 MG PO TABS: 5.0000 mg | ORAL_TABLET | Freq: Every day | ORAL | 0 refills | Status: DC

## 2023-08-05 MED ORDER — DAPAGLIFLOZIN PROPANEDIOL 5 MG PO TABS: 5.0000 mg | ORAL_TABLET | Freq: Every day | ORAL | 3 refills | Status: DC

## 2023-08-05 NOTE — Telephone Encounter (Signed)
 Copied from CRM [id number]. Topic: Clinical - Medication Refill >> Aug 04, 2023  9:42 AM Alyse July wrote: Most Recent Primary Care Visit:  Provider: [facility]  Department: [facility]  Visit Type: LAB VISIT  Date: 07/11/2023  Medication: dapagliflozin  propanediol (FARXIGA ) 5 MG TABS tablet  Has the patient contacted their pharmacy? Yes (Agent: If no, request that the patient contact the pharmacy for the refill. If patient does not wish to contact the pharmacy document the reason why and proceed with request.) (Agent: If yes, when and what did the pharmacy advise?)  Is this the correct pharmacy for this prescription? Yes If no, delete pharmacy and type the correct one.  This is the patient's preferred pharmacy:  [facility] [address], [place] - [address] [place] [address] Phone: [phone] Fax: [phone]   Has the prescription been filled recently? Yes  Is the patient out of the medication? Yes  Has the patient been seen for an appointment in the last year OR does the patient have an upcoming appointment? Yes  Can we respond through MyChart? Yes  Agent: Please be advised that Rx refills may take up to 3 business days. We ask that you follow-up with your pharmacy. >> Aug 05, 2023  8:54 AM Clydene Darner H wrote: Patient called this morning to follow up on the CRM from yesterday. She stated her pharmacy informed her that they have not received the Rx refill request. Patient also reported she is now out of the medication.

## 2023-08-05 NOTE — Telephone Encounter (Signed)
 Received fax request from pharmacy requesting PA for farxiga . Please advise.

## 2023-08-05 NOTE — Telephone Encounter (Signed)
 Copied from CRM [id number]. Topic: Clinical - Prescription Issue >> Aug 05, 2023  3:35 PM [place] wrote: Reason for CRM: Patient called stating that the medication  dapagliflozin  propanediol (FARXIGA ) 5 MG TABS tablet is needing a prior authorization

## 2023-08-05 NOTE — Telephone Encounter (Signed)
 [facility]

## 2023-08-06 ENCOUNTER — Telehealth: Payer: Self-pay

## 2023-08-06 ENCOUNTER — Other Ambulatory Visit (HOSPITAL_COMMUNITY): Payer: Self-pay

## 2023-08-06 ENCOUNTER — Other Ambulatory Visit: Payer: Self-pay | Admitting: Family Medicine

## 2023-08-06 DIAGNOSIS — E1159 Type 2 diabetes mellitus with other circulatory complications: Secondary | ICD-10-CM

## 2023-08-06 DIAGNOSIS — E66812 Obesity, class 2: Secondary | ICD-10-CM

## 2023-08-06 DIAGNOSIS — N1831 Chronic kidney disease, stage 3a: Secondary | ICD-10-CM

## 2023-08-06 DIAGNOSIS — E1169 Type 2 diabetes mellitus with other specified complication: Secondary | ICD-10-CM

## 2023-08-06 NOTE — Telephone Encounter (Signed)
 Pharmacy Patient Advocate Encounter   Received notification from Pt Calls Messages that prior authorization for Farxiga  5mg  is required/requested.   Insurance verification completed.   The patient is insured through [facility] .   Per test claim: PA required; PA submitted to above mentioned insurance via CoverMyMeds Key/confirmation #/[id number] Status is pending

## 2023-08-06 NOTE — Telephone Encounter (Signed)
 Pharmacy Patient Advocate Encounter  Received notification from [facility] that Prior Authorization for Farxiga  5mg   has been APPROVED from 07/07/23 to 08/05/24   PA #/Case ID/Reference #: [id number]  [facility] at [facility] to notify of the approval

## 2023-08-07 ENCOUNTER — Ambulatory Visit: Admitting: Family Medicine

## 2023-08-07 ENCOUNTER — Encounter: Payer: Self-pay | Admitting: Family Medicine

## 2023-08-07 VITALS — BP 128/84 | HR 88 | Temp 97.8°F | Ht 63.0 in | Wt 226.6 lb

## 2023-08-07 DIAGNOSIS — E1122 Type 2 diabetes mellitus with diabetic chronic kidney disease: Secondary | ICD-10-CM | POA: Diagnosis not present

## 2023-08-07 DIAGNOSIS — E66813 Obesity, class 3: Secondary | ICD-10-CM | POA: Diagnosis not present

## 2023-08-07 DIAGNOSIS — N1831 Chronic kidney disease, stage 3a: Secondary | ICD-10-CM | POA: Diagnosis not present

## 2023-08-07 DIAGNOSIS — Z7984 Long term (current) use of oral hypoglycemic drugs: Secondary | ICD-10-CM

## 2023-08-07 DIAGNOSIS — N1832 Chronic kidney disease, stage 3b: Secondary | ICD-10-CM | POA: Insufficient documentation

## 2023-08-07 DIAGNOSIS — Z23 Encounter for immunization: Secondary | ICD-10-CM

## 2023-08-07 DIAGNOSIS — Z114 Encounter for screening for human immunodeficiency virus [HIV]: Secondary | ICD-10-CM

## 2023-08-07 DIAGNOSIS — Z6841 Body Mass Index (BMI) 40.0 and over, adult: Secondary | ICD-10-CM

## 2023-08-07 DIAGNOSIS — E876 Hypokalemia: Secondary | ICD-10-CM

## 2023-08-07 DIAGNOSIS — E559 Vitamin D deficiency, unspecified: Secondary | ICD-10-CM | POA: Diagnosis not present

## 2023-08-07 LAB — COMPREHENSIVE METABOLIC PANEL WITH GFR
ALT: 14 U/L (ref 0–35)
AST: 18 U/L (ref 0–37)
Albumin: 4.4 g/dL (ref 3.5–5.2)
Alkaline Phosphatase: 74 U/L (ref 39–117)
BUN: 14 mg/dL (ref 6–23)
CO2: 29 meq/L (ref 19–32)
Calcium: 9.7 mg/dL (ref 8.4–10.5)
Chloride: 98 meq/L (ref 96–112)
Creatinine, Ser: 1.31 mg/dL — ABNORMAL HIGH (ref 0.40–1.20)
GFR: 45.21 mL/min — ABNORMAL LOW (ref >60.00)
Glucose, Bld: 116 mg/dL — ABNORMAL HIGH (ref 70–99)
Potassium: 3.1 meq/L — ABNORMAL LOW (ref 3.5–5.1)
Sodium: 137 meq/L (ref 135–145)
Total Bilirubin: 0.4 mg/dL (ref 0.2–1.2)
Total Protein: 8 g/dL (ref 6.0–8.3)

## 2023-08-07 MED ORDER — POTASSIUM CHLORIDE CRYS ER 20 MEQ PO TBCR: 20.0000 meq | EXTENDED_RELEASE_TABLET | Freq: Every day | ORAL | 3 refills | Status: AC

## 2023-08-07 MED ORDER — TIRZEPATIDE 2.5 MG/0.5ML ~~LOC~~ SOAJ: 2.5000 mg | SUBCUTANEOUS | 0 refills | Status: AC

## 2023-08-07 NOTE — Patient Instructions (Signed)
  VISIT SUMMARY: During your visit, we reviewed your current health status and management of your chronic conditions, including diabetes and chronic kidney disease. We also discussed your vitamin D  deficiency and general health maintenance, including vaccinations and screenings.  YOUR PLAN: -WELLNESS VISIT: This visit was to review your overall health and manage your chronic conditions. We administered a tetanus vaccine and performed an HIV screening. We also discussed the shingles and pneumonia vaccines for future visits.  -TYPE 2 DIABETES MELLITUS: Type 2 diabetes is a condition where your body does not use insulin properly, leading to high blood sugar levels. You are currently managing it with Farxiga  and have not experienced any side effects. We discussed the potential use of Ozempic or Mounjaro for weight loss and additional diabetes management. You will continue with Farxiga , and we will start you on tirzepatide at 2.5 mg weekly, pending insurance coverage. We recommend lifestyle changes including a low carb, low fat, Mediterranean diet, and regular exercise. We will follow up with your insurance regarding coverage for tirzepatide or Ozempic.  -CHRONIC KIDNEY DISEASE: Chronic kidney disease is a long-term condition where the kidneys do not work as well as they should. We are monitoring your kidney function due to your use of Farxiga , which can provide long-term kidney protection. We will recheck your kidney function with blood work and assess your response to Farxiga  with a urine test.  -VITAMIN D  DEFICIENCY: Vitamin D  deficiency means you have lower than normal levels of vitamin D , which is important for bone health. This may be related to your kidney function. You should continue your weekly vitamin D  supplementation.  -GENERAL HEALTH MAINTENANCE: We discussed the importance of vaccinations and screenings. You have not received the shingles or pneumonia vaccines and your last tetanus shot was  over ten years ago. We will request records from [facility] for your eye exam history.  INSTRUCTIONS: Please follow up with your insurance regarding coverage for tirzepatide or Ozempic. We will recheck your kidney function and urine to assess your response to Farxiga . Continue your weekly vitamin D  supplementation. Schedule your next eye exam for August and consider getting the shingles and pneumonia vaccines in future visits.                      Contains text generated by Abridge.                                 Contains text generated by Abridge.

## 2023-08-07 NOTE — Addendum Note (Signed)
 Addended by: Kasandra Pain B on: 08/07/2023 02:53 PM   Modules accepted: Orders

## 2023-08-07 NOTE — Progress Notes (Signed)
 Assessment/Plan:    Assessment & Plan Wellness Visit Routine wellness visit to review current health status and management of chronic conditions. - Administer tetanus vaccine. - Perform HIV screening. - Discuss shingles and pneumonia vaccines for future visits.  Type 2 diabetes mellitus Type 2 diabetes mellitus managed with Farxiga . No side effects such as urinary symptoms or excessive thirst. Blood sugars not checked at home recently. Discussed potential use of Ozempic or Mounjaro for weight loss and additional diabetes management. No pancreatitis, gallbladder disease, liver disease, gastroparesis, thyroid  cancers, or multiple endocrine neoplasia type 2. Discussed insurance coverage and potential side effects such as nausea. - Continue Farxiga . - Prescribe tirzepatide starting at 2.5 mg weekly, pending insurance coverage. - Recommend lifestyle modifications including low carb, low fat, Mediterranean diet, and regular exercise. - Follow up with insurance regarding coverage for tirzepatide or Ozempic.  Chronic kidney disease Chronic kidney disease with previous lab work indicating kidney changes. Monitoring kidney function due to Farxiga  use, which provides long-term kidney protection but may not be tolerated by all patients. Vitamin D  deficiency may be related to kidney function. - Recheck kidney function with blood work. - Recheck urine to assess response to Farxiga .  Vitamin D  deficiency Chronic vitamin D  deficiency, possibly related to kidney function. Managed with weekly vitamin D  supplementation. - Continue weekly vitamin D  supplementation.  General Health Maintenance Discussion of health maintenance including vaccinations and screenings. She has not received shingles or pneumonia vaccines and has not had a tetanus shot in over ten years. - Request records from [facility] for eye exam history.      There are no discontinued medications.  Return in 1 month (on  09/07/2023) for DM weight management.    Subjective:   Encounter date: 08/07/2023  Bailey Ward is a 58 y.o. female who has Hypertension associated with diabetes (HCC); Hair loss; Encounter for general adult medical examination with abnormal findings; Obesity; Vitamin D  deficiency; Chronic pain of left knee; Idiopathic chronic venous hypertension of both lower extremities with inflammation; S/P hysterectomy; Hyperpigmentation; Eczema; Hyperlipidemia associated with type 2 diabetes mellitus (HCC); Type 2 diabetes mellitus with stage 3a chronic kidney disease, without long-term current use of insulin (HCC); Ankle swelling; Discoloration and thickening of nails both feet; Hypokalemia; and Chronic kidney disease (CKD) stage G3b/A2, moderately decreased glomerular filtration rate (GFR) between 30-44 mL/min/1.73 square meter and albuminuria creatinine ratio between 30-299 mg/g (HCC) on their problem list..   She  has a past medical history of Allergy and Hypertension..   She presents with chief complaint of Medical Management of Chronic Issues (1 month follow up) .   Discussed the use of AI scribe software for clinical note transcription with the patient, who gave verbal consent to proceed.  History of Present Illness Bailey Ward is a 58 year old female with diabetes and chronic kidney disease who presents for follow-up on medication management.  She is currently taking Farxiga , vitamin D , and potassium. Since starting Farxiga , she has not experienced any urinary symptoms, excessive urination, or thirst. She has not been monitoring her blood sugars at home but denies symptoms of hypoglycemia such as shakiness.  Her vitamin D  levels have been consistently low, potentially related to her chronic kidney disease. She is taking vitamin D  once a week and has a few weeks remaining in this regimen. She reports no palpitations or other issues since starting the supplements.  Her chronic  kidney disease is under monitoring, with recent lab work conducted to  assess kidney function after initiating Farxiga . She has a history of proteinuria and low vitamin D  levels.  She is exploring options for weight loss and is considering medications like Ozempic or Mounjaro, which are also used for diabetes management. She has no history of pancreatitis, gallbladder disease, liver disease, gastroparesis, thyroid  cancers, or multiple endocrine neoplasia type two.  She underwent cataract surgery in June of the previous year and last visited her eye doctor in August. She is due for an eye exam in August of this year.  She has not received the shingles, pneumonia, or tetanus vaccines recently, with her last tetanus shot being over ten years ago.       Past Surgical History:  Procedure Laterality Date   ABDOMINAL HYSTERECTOMY      Outpatient Medications Prior to Visit  Medication Sig Dispense Refill   amLODipine  (NORVASC ) 10 MG tablet Take 1 tablet (10 mg total) by mouth daily. 90 tablet 3   dapagliflozin  propanediol (FARXIGA ) 5 MG TABS tablet Take 1 tablet (5 mg total) by mouth daily before breakfast. 90 tablet 3   losartan -hydrochlorothiazide  (HYZAAR) 100-25 MG tablet TAKE 1 TABLET BY MOUTH EVERY DAY 90 tablet 3   rosuvastatin  (CRESTOR ) 20 MG tablet TAKE 1 TABLET BY MOUTH EVERY DAY 90 tablet 3   Vitamin D , Ergocalciferol , (DRISDOL ) 1.25 MG (50000 UNIT) CAPS capsule Take 1 capsule (50,000 Units total) by mouth every 7 (seven) days. 8 capsule 0   No facility-administered medications prior to visit.    Family History  Problem Relation Age of Onset   Hypertension Mother    Diabetes Father    Hypertension Father     Social History   Socioeconomic History   Marital status: Married    Spouse name: Not on file   Number of children: 2   Years of education: 68   Highest education level: Not on file  Occupational History   Occupation: Annuity Specialist  Tobacco Use   Smoking  status: Never   Smokeless tobacco: Never  Vaping Use   Vaping status: Never Used  Substance and Sexual Activity   Alcohol use: No   Drug use: No   Sexual activity: Yes  Other Topics Concern   Not on file  Social History Narrative   Born in [place], [place] and raised in [place]. Fun: Travel, go to [employer], watch her daughter play soccer, watch movies   Denies religious beliefs that would effect health care.    Denies abuse and feels safe at home.    Social Drivers of Corporate investment banker Strain: Not on file  Food Insecurity: Not on file  Transportation Needs: Not on file  Physical Activity: Not on file  Stress: Not on file  Social Connections: Not on file  Intimate Partner Violence: Not on file                                                                                                  Objective:  Physical Exam: BP 128/84   Pulse 88   Temp 97.8 F (36.6 C) (Temporal)   Ht 5'  3" (1.6 m)   Wt 226 lb 9.6 oz (102.8 kg)   SpO2 98%   BMI 40.14 kg/m    Physical Exam GENERAL: Alert, cooperative, well developed, no acute distress HEENT: Normocephalic, normal oropharynx, moist mucous membranes CHEST: Clear to auscultation bilaterally, no wheezes, rhonchi, or crackles CARDIOVASCULAR: Normal heart rate and rhythm, S1 and S2 normal without murmurs ABDOMEN: Soft, non-tender, non-distended, without organomegaly, normal bowel sounds EXTREMITIES: No cyanosis or edema NEUROLOGICAL: Cranial nerves grossly intact, moves all extremities without gross motor or sensory deficit     No results found.  Recent Results (from the past 2160 hours)  Urinalysis w microscopic + reflex cultur     Status: Abnormal   Collection Time: 06/30/23  8:34 AM   Specimen: Blood  Result Value Ref Range   Color, Urine YELLOW YELLOW   APPearance CLEAR CLEAR   Specific Gravity, Urine 1.017 1.001 - 1.035   pH 6.5 5.0 - 8.0   Glucose, UA NEGATIVE NEGATIVE   Bilirubin Urine NEGATIVE NEGATIVE    Ketones, ur NEGATIVE NEGATIVE   Hgb urine dipstick NEGATIVE NEGATIVE   Protein, ur 1+ (A) NEGATIVE   Nitrites, Initial NEGATIVE NEGATIVE   Leukocyte Esterase TRACE (A) NEGATIVE   WBC, UA 0-5 0 - 5 /HPF   RBC / HPF NONE SEEN 0 - 2 /HPF   Squamous Epithelial / HPF 10-20 (A) < OR = 5 /HPF   Bacteria, UA FEW (A) NONE SEEN /HPF   Hyaline Cast 6-10 (A) NONE SEEN /LPF  Urine Culture     Status: None   Collection Time: 06/30/23  8:34 AM  Result Value Ref Range   MICRO NUMBER: [id number]    SPECIMEN QUALITY: Adequate    Sample Source URINE    STATUS: FINAL    Result: No Growth   REFLEXIVE URINE CULTURE     Status: None   Collection Time: 06/30/23  8:34 AM  Result Value Ref Range   REFLEXIVE URINE CULTURE      Comment: CULTURE INDICATED - RESULTS TO FOLLOW  CBC with Differential/Platelet     Status: None   Collection Time: 06/30/23  9:11 AM  Result Value Ref Range   WBC 4.7 4.0 - 10.5 K/uL   RBC 4.52 3.87 - 5.11 Mil/uL   Hemoglobin 12.3 12.0 - 15.0 g/dL   HCT 57.8 46.9 - 62.9 %   MCV 83.8 78.0 - 100.0 fl   MCHC 32.5 30.0 - 36.0 g/dL   RDW 52.8 41.3 - 24.4 %   Platelets 334.0 150.0 - 400.0 K/uL   Neutrophils Relative % 54.0 43.0 - 77.0 %   Lymphocytes Relative 33.9 12.0 - 46.0 %   Monocytes Relative 7.5 3.0 - 12.0 %   Eosinophils Relative 4.3 0.0 - 5.0 %   Basophils Relative 0.3 0.0 - 3.0 %   Neutro Abs 2.6 1.4 - 7.7 K/uL   Lymphs Abs 1.6 0.7 - 4.0 K/uL   Monocytes Absolute 0.4 0.1 - 1.0 K/uL   Eosinophils Absolute 0.2 0.0 - 0.7 K/uL   Basophils Absolute 0.0 0.0 - 0.1 K/uL  Comprehensive metabolic panel     Status: Abnormal   Collection Time: 06/30/23  9:11 AM  Result Value Ref Range   Sodium 137 135 - 145 mEq/L   Potassium 3.3 (L) 3.5 - 5.1 mEq/L   Chloride 96 96 - 112 mEq/L   CO2 31 19 - 32 mEq/L   Glucose, Bld 173 (H) 70 - 99 mg/dL   BUN 17 6 -  23 mg/dL   Creatinine, Ser 1.61 (H) 0.40 - 1.20 mg/dL   Total Bilirubin 0.4 0.2 - 1.2 mg/dL   Alkaline Phosphatase 78 39 - 117  U/L   AST 17 0 - 37 U/L   ALT 11 0 - 35 U/L   Total Protein 7.8 6.0 - 8.3 g/dL   Albumin 4.4 3.5 - 5.2 g/dL   GFR 09.60 (L) >45.40 mL/min    Comment: Calculated using the CKD-EPI Creatinine Equation (2021)   Calcium  9.9 8.4 - 10.5 mg/dL  Hemoglobin J8J     Status: Abnormal   Collection Time: 06/30/23  9:11 AM  Result Value Ref Range   Hgb A1c MFr Bld 6.9 (H) 4.6 - 6.5 %    Comment: Glycemic Control Guidelines for People with Diabetes:Non Diabetic:  <6%Goal of Therapy: <7%Additional Action Suggested:  >8%   Lipid Profile     Status: Abnormal   Collection Time: 06/30/23  9:11 AM  Result Value Ref Range   Cholesterol 113 0 - 200 mg/dL    Comment: ATP III Classification       Desirable:  < 200 mg/dL               Borderline High:  200 - 239 mg/dL          High:  > = 191 mg/dL   Triglycerides 478.2 (H) 0.0 - 149.0 mg/dL    Comment: Normal:  <956 mg/dLBorderline High:  150 - 199 mg/dL   HDL 21.30 (L) >86.57 mg/dL   VLDL 84.6 (H) 0.0 - 96.2 mg/dL   LDL Cholesterol 21 0 - 99 mg/dL   Total CHOL/HDL Ratio 3     Comment:                Men          Women1/2 Average Risk     3.4          3.3Average Risk          5.0          4.42X Average Risk          9.6          7.13X Average Risk          15.0          11.0                       NonHDL 80.13     Comment: NOTE:  Non-HDL goal should be 30 mg/dL higher than patient's LDL goal (i.e. LDL goal of < 70 mg/dL, would have non-HDL goal of < 100 mg/dL)  VITAMIN D  25 Hydroxy (Vit-D Deficiency, Fractures)     Status: Abnormal   Collection Time: 06/30/23  9:11 AM  Result Value Ref Range   VITD 16.28 (L) 30.00 - 100.00 ng/mL  Thyroid  Panel With TSH     Status: None   Collection Time: 07/11/23  9:13 AM  Result Value Ref Range   T3 Uptake 29 22 - 35 %   T4, Total 7.4 5.1 - 11.9 mcg/dL   Free Thyroxine Index 2.1 1.4 - 3.8   TSH 2.60 0.40 - 4.50 mIU/L  Parathyroid  hormone, intact (no Ca)     Status: None   Collection Time: 07/11/23  9:13 AM  Result  Value Ref Range   PTH 35 16 - 77 pg/mL    Comment: . Interpretive Guide    Intact PTH  Calcium  ------------------    ----------           ------- Normal Parathyroid     Normal               Normal Hypoparathyroidism    Low or Low Normal    Low Hyperparathyroidism    Primary            Normal or High       High    Secondary          High                 Normal or Low    Tertiary           High                 High Non-Parathyroid     Hypercalcemia      Low or Low Normal    High .   Phosphorus     Status: None   Collection Time: 07/11/23  9:13 AM  Result Value Ref Range   Phosphorus 3.6 2.3 - 4.6 mg/dL  Basic Metabolic Panel (BMET)     Status: Abnormal   Collection Time: 07/11/23  9:13 AM  Result Value Ref Range   Sodium 140 135 - 145 mEq/L   Potassium 3.5 3.5 - 5.1 mEq/L   Chloride 98 96 - 112 mEq/L   CO2 31 19 - 32 mEq/L   Glucose, Bld 119 (H) 70 - 99 mg/dL   BUN 16 6 - 23 mg/dL   Creatinine, Ser 1.61 (H) 0.40 - 1.20 mg/dL   GFR 09.60 (L) >45.40 mL/min    Comment: Calculated using the CKD-EPI Creatinine Equation (2021)   Calcium  9.8 8.4 - 10.5 mg/dL  Microalbumin / creatinine urine ratio     Status: None   Collection Time: 07/11/23  9:21 AM  Result Value Ref Range   Microalb, Ur 1.9 0.0 - 1.9 mg/dL   Creatinine,U 981.1 mg/dL   Microalb Creat Ratio 15.1 0.0 - 30.0 mg/g        Carnell Christian, MD, MS

## 2023-08-07 NOTE — Addendum Note (Signed)
 Addended by: Alejos Husband on: 08/07/2023 09:37 AM   Modules accepted: Orders

## 2023-08-08 LAB — URINALYSIS W MICROSCOPIC + REFLEX CULTURE
Bilirubin Urine: NEGATIVE
Hgb urine dipstick: NEGATIVE
Ketones, ur: NEGATIVE
Leukocyte Esterase: NEGATIVE
Nitrites, Initial: NEGATIVE
Protein, ur: NEGATIVE
RBC / HPF: NONE SEEN /HPF (ref 0–2)
Specific Gravity, Urine: 1.023 (ref 1.001–1.035)
pH: 6.5 (ref 5.0–8.0)

## 2023-08-08 LAB — NO CULTURE INDICATED

## 2023-08-12 LAB — VITAMIN D 1,25 DIHYDROXY
Vitamin D 1, 25 (OH)2 Total: 36 pg/mL (ref 18–72)
Vitamin D2 1, 25 (OH)2: 24 pg/mL
Vitamin D3 1, 25 (OH)2: 12 pg/mL

## 2023-08-12 LAB — HIV ANTIBODY (ROUTINE TESTING W REFLEX): HIV 1&2 Ab, 4th Generation: NONREACTIVE

## 2023-08-15 ENCOUNTER — Telehealth: Payer: Self-pay

## 2023-08-15 ENCOUNTER — Other Ambulatory Visit (HOSPITAL_COMMUNITY): Payer: Self-pay

## 2023-08-15 NOTE — Telephone Encounter (Signed)
 Pharmacy Patient Advocate Encounter   Received notification from CoverMyMeds that prior authorization for Mounjaro 2.5MG /0.5ML auto-injectors  is required/requested.   Insurance verification completed.   The patient is insured through [facility] .   Per test claim: PA required; PA started via CoverMyMeds. KEY [id number] . Waiting for clinical questions to populate.

## 2023-08-15 NOTE — Telephone Encounter (Signed)
 Pharmacy Patient Advocate Encounter  Received notification from [facility] that Prior Authorization for Mounjaro 2.5 has been APPROVED from 08/15/23 to 08/14/24. Ran test claim, Copay is $35.00. This test claim was processed through [facility]- copay amounts may vary at other pharmacies due to pharmacy/plan contracts, or as the patient moves through the different stages of their insurance plan.   PA #/Case ID/Reference #: [id number]

## 2023-09-08 ENCOUNTER — Ambulatory Visit: Admitting: Family Medicine

## 2023-09-08 VITALS — BP 110/71 | HR 75 | Temp 97.2°F | Resp 18 | Wt 225.2 lb

## 2023-09-08 LAB — POCT GLYCOSYLATED HEMOGLOBIN (HGB A1C): Hemoglobin A1C: 6.7 % — AB (ref 4.0–5.6)

## 2023-09-09 NOTE — Progress Notes (Signed)
 error

## 2023-09-15 ENCOUNTER — Ambulatory Visit: Admitting: Family Medicine

## 2023-09-30 ENCOUNTER — Ambulatory Visit: Admitting: Family Medicine

## 2023-10-01 ENCOUNTER — Encounter: Payer: Self-pay | Admitting: Family Medicine

## 2023-10-01 ENCOUNTER — Ambulatory Visit (INDEPENDENT_AMBULATORY_CARE_PROVIDER_SITE_OTHER): Admitting: Family Medicine

## 2023-10-01 VITALS — BP 104/68 | HR 75 | Temp 98.4°F | Ht 63.0 in | Wt 223.0 lb

## 2023-10-01 DIAGNOSIS — I152 Hypertension secondary to endocrine disorders: Secondary | ICD-10-CM

## 2023-10-01 DIAGNOSIS — I87323 Chronic venous hypertension (idiopathic) with inflammation of bilateral lower extremity: Secondary | ICD-10-CM | POA: Diagnosis not present

## 2023-10-01 DIAGNOSIS — N1831 Chronic kidney disease, stage 3a: Secondary | ICD-10-CM

## 2023-10-01 DIAGNOSIS — Z6838 Body mass index (BMI) 38.0-38.9, adult: Secondary | ICD-10-CM

## 2023-10-01 DIAGNOSIS — E1159 Type 2 diabetes mellitus with other circulatory complications: Secondary | ICD-10-CM

## 2023-10-01 DIAGNOSIS — E1122 Type 2 diabetes mellitus with diabetic chronic kidney disease: Secondary | ICD-10-CM

## 2023-10-01 DIAGNOSIS — Z7985 Long-term (current) use of injectable non-insulin antidiabetic drugs: Secondary | ICD-10-CM

## 2023-10-01 DIAGNOSIS — E66812 Obesity, class 2: Secondary | ICD-10-CM

## 2023-10-01 DIAGNOSIS — M25473 Effusion, unspecified ankle: Secondary | ICD-10-CM

## 2023-10-01 MED ORDER — TIRZEPATIDE 5 MG/0.5ML ~~LOC~~ SOAJ: 5.0000 mg | SUBCUTANEOUS | 0 refills | Status: DC

## 2023-10-01 MED ORDER — TIRZEPATIDE 7.5 MG/0.5ML ~~LOC~~ SOAJ: 7.5000 mg | SUBCUTANEOUS | 0 refills | Status: DC

## 2023-10-01 NOTE — Patient Instructions (Signed)
  VISIT SUMMARY: Today, you came in for a follow-up on your diabetes and weight management after starting Mounjaro . You have lost three pounds since May, and your blood sugar levels are well-managed with no side effects from the medication. We also discussed your blood pressure and made some adjustments to your medications.  YOUR PLAN: -TYPE 2 DIABETES MELLITUS: Type 2 Diabetes Mellitus is a condition where your body does not use insulin properly, leading to high blood sugar levels. Your A1c is at a good level at 6.7%. We will increase your Mounjaro  dose to 5 mg for two months and then to 7.5 mg if tolerated, while monitoring for any side effects like nausea. Continue taking Farxiga  as well.  -HYPERTENSION: Hypertension is high blood pressure. Your blood pressure was a bit low today, likely due to your recent weight loss. We will reduce your losartan /hydrochlorothiazide  dose to 50/12.5 mg daily and discontinue amlodipine . Please monitor your blood pressure at home. If it goes above 130/80 mmHg, we may need to restart half of the amlodipine  dose.  -OBESITY: Obesity is a condition where excess body fat affects your health. You have lost weight since starting Mounjaro , which is beneficial for managing your diabetes, hypertension, and kidney health. Continue with your weight management efforts and monitor your weight regularly.  INSTRUCTIONS: Increase Mounjaro  to 5 mg for two months. Plan to increase to 7.5 mg after two months if tolerated. Monitor for gastrointestinal side effects such as nausea. Reduce losartan /hydrochlorothiazide  to 50/12.5 mg daily. Discontinue amlodipine . Monitor blood pressure at home. If blood pressure rebounds above 130/80 mmHg, consider restarting half of the amlodipine  dose.

## 2023-10-01 NOTE — Progress Notes (Signed)
 Assessment & Plan   Assessment/Plan:     Assessment & Plan Type 2 Diabetes Mellitus Type 2 Diabetes Mellitus is managed with Mounjaro  and Farxiga . A1c is at goal at 6.7%. She reports no side effects from Mounjaro  and has experienced weight loss, beneficial for diabetes management and kidney protection. Currently on the lowest dose of Mounjaro  (2.5 mg) and tolerating it well. Mounjaro  offers benefits including weight management, kidney protection, and heart protection. The plan is to increase the dose gradually to optimize these benefits while monitoring for side effects. - Increase Mounjaro  to 5 mg for two months - Plan to increase to 7.5 mg after two months if tolerated - Monitor for gastrointestinal side effects such as nausea - Continue Farxiga   Hypertension Hypertension is well-controlled, but blood pressure was low today at 104/68 mmHg, likely due to weight loss. She is on losartan /hydrochlorothiazide  and amlodipine . Expressed concern about leg swelling, possibly related to amlodipine . The decision was made to adjust medications to address low blood pressure and swelling while maintaining kidney protection. - Reduce losartan /hydrochlorothiazide  to 50/12.5 mg daily - Discontinue amlodipine  - Monitor blood pressure at home - If blood pressure rebounds above 130/80 mmHg, consider restarting half of the amlodipine  dose  Obesity She has lost weight since starting Mounjaro , with a reduction from 226 lbs to 223 lbs. Weight loss is beneficial for managing diabetes, hypertension, and kidney protection. The focus is on continuing weight management to further improve health outcomes. - Continue weight management efforts - Monitor weight and adjust treatment as necessary      Medications Discontinued During This Encounter  Medication Reason   Vitamin D , Ergocalciferol , (DRISDOL ) 1.25 MG (50000 UNIT) CAPS capsule    amLODipine  (NORVASC ) 10 MG tablet    MOUNJARO  2.5 MG/0.5ML Pen      Return in about 3 months (around 01/01/2024) for DM, BP.        Subjective:   Encounter date: 10/01/2023  Shadae Reino Troy-Richardson is a 58 y.o. female who has Hypertension associated with diabetes (HCC); Hair loss; Encounter for general adult medical examination with abnormal findings; Obesity; Vitamin D  deficiency; Chronic pain of left knee; Idiopathic chronic venous hypertension of both lower extremities with inflammation; S/P hysterectomy; Hyperpigmentation; Eczema; Hyperlipidemia associated with type 2 diabetes mellitus (HCC); Type 2 diabetes mellitus with stage 3a chronic kidney disease, without long-term current use of insulin (HCC); Ankle swelling; Discoloration and thickening of nails both feet; Hypokalemia; and Chronic kidney disease (CKD) stage G3b/A2, moderately decreased glomerular filtration rate (GFR) between 30-44 mL/min/1.73 square meter and albuminuria creatinine ratio between 30-299 mg/g (HCC) on their problem list..   She  has a past medical history of Allergy and Hypertension..   She presents with chief complaint of Follow-up and Medication Refill .   Discussed the use of AI scribe software for clinical note transcription with the patient, who gave verbal consent to proceed.  History of Present Illness KYNADEE DAM is a 58 year old female with diabetes who presents for follow-up on diabetes and weight management after starting Mounjaro .  She has lost approximately three pounds since the beginning of May, with her weight decreasing from 226 pounds to 223 pounds. She is currently on the lowest dose of Mounjaro , 2.5 mg, and has not experienced any side effects. The medication is effectively managing her blood sugar levels, as she no longer experiences cravings.  Her A1c was last recorded at 6.7. She is also on Farxiga  for diabetes management. Additionally, she takes losartan  hydrochlorothiazide  and amlodipine   for blood pressure management. She does not currently  monitor her blood pressure at home.  No chest pain, shortness of breath, dizziness, urinary symptoms, or increased thirst.     ROS  Past Surgical History:  Procedure Laterality Date   ABDOMINAL HYSTERECTOMY      Outpatient Medications Prior to Visit  Medication Sig Dispense Refill   dapagliflozin  propanediol (FARXIGA ) 5 MG TABS tablet Take 1 tablet (5 mg total) by mouth daily before breakfast. 90 tablet 3   losartan -hydrochlorothiazide  (HYZAAR) 100-25 MG tablet TAKE 1 TABLET BY MOUTH EVERY DAY 90 tablet 3   potassium chloride  SA (KLOR-CON  M) 20 MEQ tablet Take 1 tablet (20 mEq total) by mouth daily. 90 tablet 3   rosuvastatin  (CRESTOR ) 20 MG tablet TAKE 1 TABLET BY MOUTH EVERY DAY 90 tablet 3   amLODipine  (NORVASC ) 10 MG tablet Take 1 tablet (10 mg total) by mouth daily. 90 tablet 3   MOUNJARO  2.5 MG/0.5ML Pen Inject 2.5 mg into the skin once a week.     Vitamin D , Ergocalciferol , (DRISDOL ) 1.25 MG (50000 UNIT) CAPS capsule Take 1 capsule (50,000 Units total) by mouth every 7 (seven) days. (Patient not taking: Reported on 10/01/2023) 8 capsule 0   No facility-administered medications prior to visit.    Family History  Problem Relation Age of Onset   Hypertension Mother    Diabetes Father    Hypertension Father     Social History   Socioeconomic History   Marital status: Married    Spouse name: Not on file   Number of children: 2   Years of education: 61   Highest education level: Not on file  Occupational History   Occupation: Annuity Specialist  Tobacco Use   Smoking status: Never   Smokeless tobacco: Never  Vaping Use   Vaping status: Never Used  Substance and Sexual Activity   Alcohol use: No   Drug use: No   Sexual activity: Yes  Other Topics Concern   Not on file  Social History Narrative   Born in [place], [place] and raised in [place]. Fun: Travel, go to [employer], watch her daughter play soccer, watch movies   Denies religious beliefs that would effect health  care.    Denies abuse and feels safe at home.    Social Drivers of Corporate investment banker Strain: Not on file  Food Insecurity: Not on file  Transportation Needs: Not on file  Physical Activity: Not on file  Stress: Not on file  Social Connections: Not on file  Intimate Partner Violence: Not on file                                                                                                  Objective:  Physical Exam: BP 104/68   Pulse 75   Temp 98.4 F (36.9 C) (Temporal)   Ht 5' 3 (1.6 m)   Wt 223 lb (101.2 kg)   SpO2 95%   BMI 39.50 kg/m   Wt Readings from Last 3 Encounters:  10/01/23 223 lb (101.2 kg)  09/08/23 225 lb 3.2 oz (  102.2 kg)  08/07/23 226 lb 9.6 oz (102.8 kg)    Physical Exam VITALS: BP- 104/68 MEASUREMENTS: Weight- 223. GENERAL: Alert, cooperative, well developed, no acute distress HEENT: Normocephalic, normal oropharynx, moist mucous membranes CHEST: Clear to auscultation bilaterally, no wheezes, rhonchi, or crackles CARDIOVASCULAR: Normal heart rate and rhythm, S1 and S2 normal without murmurs ABDOMEN: Soft, non-tender, non-distended, without organomegaly, normal bowel sounds EXTREMITIES: 1+ edema bilateral lower extremities, pitting NEUROLOGICAL: Cranial nerves grossly intact, moves all extremities without gross motor or sensory deficit   Physical Exam  No results found.  Recent Results (from the past 2160 hours)  Thyroid  Panel With TSH     Status: None   Collection Time: 07/11/23  9:13 AM  Result Value Ref Range   T3 Uptake 29 22 - 35 %   T4, Total 7.4 5.1 - 11.9 mcg/dL   Free Thyroxine Index 2.1 1.4 - 3.8   TSH 2.60 0.40 - 4.50 mIU/L  Parathyroid  hormone, intact (no Ca)     Status: None   Collection Time: 07/11/23  9:13 AM  Result Value Ref Range   PTH 35 16 - 77 pg/mL    Comment: . Interpretive Guide    Intact PTH           Calcium  ------------------    ----------           ------- Normal Parathyroid     Normal                Normal Hypoparathyroidism    Low or Low Normal    Low Hyperparathyroidism    Primary            Normal or High       High    Secondary          High                 Normal or Low    Tertiary           High                 High Non-Parathyroid     Hypercalcemia      Low or Low Normal    High .   Phosphorus     Status: None   Collection Time: 07/11/23  9:13 AM  Result Value Ref Range   Phosphorus 3.6 2.3 - 4.6 mg/dL  Basic Metabolic Panel (BMET)     Status: Abnormal   Collection Time: 07/11/23  9:13 AM  Result Value Ref Range   Sodium 140 135 - 145 mEq/L   Potassium 3.5 3.5 - 5.1 mEq/L   Chloride 98 96 - 112 mEq/L   CO2 31 19 - 32 mEq/L   Glucose, Bld 119 (H) 70 - 99 mg/dL   BUN 16 6 - 23 mg/dL   Creatinine, Ser 8.58 (H) 0.40 - 1.20 mg/dL   GFR 58.58 (L) >39.99 mL/min    Comment: Calculated using the CKD-EPI Creatinine Equation (2021)   Calcium  9.8 8.4 - 10.5 mg/dL  Microalbumin / creatinine urine ratio     Status: None   Collection Time: 07/11/23  9:21 AM  Result Value Ref Range   Microalb, Ur 1.9 0.0 - 1.9 mg/dL   Creatinine,U 875.7 mg/dL   Microalb Creat Ratio 15.1 0.0 - 30.0 mg/g  Vitamin D  1,25 dihydroxy     Status: None   Collection Time: 08/07/23  8:59 AM  Result Value Ref Range   Vitamin D  1, 25 (OH)2 Total  36 18 - 72 pg/mL   Vitamin D3 1, 25 (OH)2 12 pg/mL   Vitamin D2 1, 25 (OH)2 24 pg/mL    Comment: (Note) Vitamin D3, 1,25(OH)2 indicates both endogenous  production and supplementation. Vitamin D2, 1,25(OH)2 is  an indicator of exogenous sources, such as diet or  supplementation. Interpretation and therapy are based on  measurement of Vitamin D , 1,25 (OH)2, Total. . This test was developed, and its analytical performance  characteristics have been determined by [employer]. It has not been cleared or approved by the  FDA. This assay has been validated pursuant to the CLIA  regulations and is used for clinical purposes. . For additional information,  please refer to http://education.[facility].com/faq/FAQ199 (This link is being provided for  informational/educational purposes only.) . [facility] [address] [place] [address] [phone] Johanna Agent L. Gino, MD, PhD   Comp Met (CMET)     Status: Abnormal   Collection Time: 08/07/23  8:59 AM  Result Value Ref Range   Sodium 137 135 - 145 mEq/L   Potassium 3.1 (L) 3.5 - 5.1 mEq/L   Chloride 98 96 - 112 mEq/L   CO2 29 19 - 32 mEq/L   Glucose, Bld 116 (H) 70 - 99 mg/dL   BUN 14 6 - 23 mg/dL   Creatinine, Ser 8.68 (H) 0.40 - 1.20 mg/dL   Total Bilirubin 0.4 0.2 - 1.2 mg/dL   Alkaline Phosphatase 74 39 - 117 U/L   AST 18 0 - 37 U/L   ALT 14 0 - 35 U/L   Total Protein 8.0 6.0 - 8.3 g/dL   Albumin 4.4 3.5 - 5.2 g/dL   GFR 54.78 (L) >39.99 mL/min    Comment: Calculated using the CKD-EPI Creatinine Equation (2021)   Calcium  9.7 8.4 - 10.5 mg/dL  HIV antibody (with reflex)     Status: None   Collection Time: 08/07/23  8:59 AM  Result Value Ref Range   HIV 1&2 Ab, 4th Generation NON-REACTIVE NON-REACTIVE    Comment: HIV-1 antigen and HIV-1/HIV-2 antibodies were not detected. There is no laboratory evidence of HIV infection. SABRA PLEASE NOTE: This information has been disclosed to you from records whose confidentiality may be protected by state law.  If your state requires such protection, then the state law prohibits you from making any further disclosure of the information without the specific written consent of the person to whom it pertains, or as otherwise permitted by law. A general authorization for the release of medical or other information is NOT sufficient for this purpose. . For additional information please refer to http://education.[facility].com/faq/FAQ106 (This link is being provided for informational/ educational purposes only.) . SABRA The performance of this assay has not been clinically validated in patients  less than 47 years old. SABRA   Urinalysis w microscopic + reflex cultur     Status: Abnormal   Collection Time: 08/07/23  9:09 AM   Specimen: Urine  Result Value Ref Range   Color, Urine YELLOW YELLOW   APPearance CLEAR CLEAR   Specific Gravity, Urine 1.023 1.001 - 1.035   pH 6.5 5.0 - 8.0   Glucose, UA 3+ (A) NEGATIVE   Bilirubin Urine NEGATIVE NEGATIVE   Ketones, ur NEGATIVE NEGATIVE   Hgb urine dipstick NEGATIVE NEGATIVE   Protein, ur NEGATIVE NEGATIVE   Nitrites, Initial NEGATIVE NEGATIVE   Leukocyte Esterase NEGATIVE NEGATIVE   WBC, UA 0-5 0 - 5 /HPF   RBC / HPF NONE  SEEN 0 - 2 /HPF   Squamous Epithelial / HPF 10-20 (A) < OR = 5 /HPF   Bacteria, UA FEW (A) NONE SEEN /HPF   Hyaline Cast 0-5 (A) NONE SEEN /LPF   Note      Comment: This urine was analyzed for the presence of WBC,  RBC, bacteria, casts, and other formed elements.  Only those elements seen were reported. . .   REFLEXIVE URINE CULTURE     Status: None   Collection Time: 08/07/23  9:09 AM  Result Value Ref Range   Reflexve Urine Culture      Comment: NO CULTURE INDICATED  POCT glycosylated hemoglobin (Hb A1C)     Status: Abnormal   Collection Time: 09/08/23  8:15 AM  Result Value Ref Range   Hemoglobin A1C 6.7 (A) 4.0 - 5.6 %   HbA1c POC (<> result, manual entry)     HbA1c, POC (prediabetic range)     HbA1c, POC (controlled diabetic range)          Beverley Adine Hummer, MD, MS

## 2023-10-23 ENCOUNTER — Other Ambulatory Visit: Payer: Self-pay | Admitting: Family Medicine

## 2023-10-23 DIAGNOSIS — I87323 Chronic venous hypertension (idiopathic) with inflammation of bilateral lower extremity: Secondary | ICD-10-CM

## 2023-10-23 DIAGNOSIS — E66812 Obesity, class 2: Secondary | ICD-10-CM

## 2023-10-23 DIAGNOSIS — E1122 Type 2 diabetes mellitus with diabetic chronic kidney disease: Secondary | ICD-10-CM

## 2023-11-01 ENCOUNTER — Other Ambulatory Visit: Payer: Self-pay | Admitting: Family Medicine

## 2023-11-01 DIAGNOSIS — E1159 Type 2 diabetes mellitus with other circulatory complications: Secondary | ICD-10-CM

## 2023-11-24 DIAGNOSIS — E559 Vitamin D deficiency, unspecified: Secondary | ICD-10-CM | POA: Diagnosis not present

## 2023-11-24 DIAGNOSIS — I129 Hypertensive chronic kidney disease with stage 1 through stage 4 chronic kidney disease, or unspecified chronic kidney disease: Secondary | ICD-10-CM | POA: Diagnosis not present

## 2023-11-24 DIAGNOSIS — N1832 Chronic kidney disease, stage 3b: Secondary | ICD-10-CM | POA: Diagnosis not present

## 2023-11-24 DIAGNOSIS — E876 Hypokalemia: Secondary | ICD-10-CM | POA: Diagnosis not present

## 2023-11-24 DIAGNOSIS — E1122 Type 2 diabetes mellitus with diabetic chronic kidney disease: Secondary | ICD-10-CM | POA: Diagnosis not present

## 2023-11-25 ENCOUNTER — Other Ambulatory Visit: Payer: Self-pay | Admitting: Nephrology

## 2023-11-25 DIAGNOSIS — N1832 Chronic kidney disease, stage 3b: Secondary | ICD-10-CM

## 2023-11-28 ENCOUNTER — Ambulatory Visit
Admission: RE | Admit: 2023-11-28 | Discharge: 2023-11-28 | Disposition: A | Discharge status: Home / Self Care | Source: Ambulatory Visit | Attending: Nephrology | Admitting: Nephrology

## 2023-11-28 DIAGNOSIS — N1832 Chronic kidney disease, stage 3b: Secondary | ICD-10-CM | POA: Diagnosis not present

## 2023-12-01 ENCOUNTER — Other Ambulatory Visit: Payer: Self-pay | Admitting: Family Medicine

## 2023-12-01 DIAGNOSIS — E1122 Type 2 diabetes mellitus with diabetic chronic kidney disease: Secondary | ICD-10-CM

## 2023-12-01 DIAGNOSIS — I87323 Chronic venous hypertension (idiopathic) with inflammation of bilateral lower extremity: Secondary | ICD-10-CM

## 2023-12-01 DIAGNOSIS — E66812 Obesity, class 2: Secondary | ICD-10-CM

## 2023-12-02 NOTE — Telephone Encounter (Signed)
 Copied from CRM #[id number]. Topic: Clinical - Medication Question >> Dec 02, 2023  9:18 AM Thersia BROCKS wrote: Reason for CRM: Patient called in regarding the prescription tirzepatide  (MOUNJARO ) 7.5 MG/0.5ML Pen , patient contacted pharmacy is no refills on medication, pharmacy wanted to know if it needs to be refill for the dosage 7.5 or increased to 10 >> Dec 02, 2023  9:20 AM Thersia C wrote: Patient normally takes it on Wednesday, so will like it refill as soon as possible

## 2023-12-02 NOTE — Telephone Encounter (Signed)
 Forwarding message below  LOV 10/01/2023 LRF 10/23/2023 RF#0 FOV 01/01/2024

## 2024-01-01 ENCOUNTER — Ambulatory Visit: Admitting: Family Medicine

## 2024-01-06 ENCOUNTER — Telehealth: Payer: Self-pay

## 2024-01-06 DIAGNOSIS — N1831 Chronic kidney disease, stage 3a: Secondary | ICD-10-CM

## 2024-01-06 MED ORDER — TIRZEPATIDE 10 MG/0.5ML ~~LOC~~ SOAJ: 10.0000 mg | SUBCUTANEOUS | 0 refills | Status: DC

## 2024-01-06 NOTE — Telephone Encounter (Signed)
 Rx sent for Mounjaro  10 mg injection weekly.  Asked that patient keep her follow-up visit.

## 2024-01-06 NOTE — Telephone Encounter (Signed)
 Copied from CRM [id number]. Topic: Clinical - Medication Question >> Jan 06, 2024 10:30 AM Bailey Ward I wrote: Reason for CRM: Patient would like to know if an increase in the [facility] prescription can be sent into the pharmacy for her  [id number] [place], [place] - [address] [name] [address] Phone: [phone] Fax: [phone] Hours: Not open 24 hours

## 2024-01-06 NOTE — Telephone Encounter (Signed)
 Forwarding message below. Please advise on request  LOV 10/01/2023 LRF 12/02/2023 RF#0 FOV 01/07/2024

## 2024-01-07 ENCOUNTER — Encounter: Payer: Self-pay | Admitting: Family Medicine

## 2024-01-07 ENCOUNTER — Ambulatory Visit: Admitting: Family Medicine

## 2024-01-07 VITALS — BP 134/89 | HR 79 | Temp 96.6°F | Ht 63.0 in | Wt 198.8 lb

## 2024-01-07 DIAGNOSIS — Z7985 Long-term (current) use of injectable non-insulin antidiabetic drugs: Secondary | ICD-10-CM

## 2024-01-07 DIAGNOSIS — B351 Tinea unguium: Secondary | ICD-10-CM | POA: Insufficient documentation

## 2024-01-07 DIAGNOSIS — I152 Hypertension secondary to endocrine disorders: Secondary | ICD-10-CM

## 2024-01-07 DIAGNOSIS — Z6835 Body mass index (BMI) 35.0-35.9, adult: Secondary | ICD-10-CM

## 2024-01-07 DIAGNOSIS — E1122 Type 2 diabetes mellitus with diabetic chronic kidney disease: Secondary | ICD-10-CM | POA: Diagnosis not present

## 2024-01-07 DIAGNOSIS — E876 Hypokalemia: Secondary | ICD-10-CM | POA: Diagnosis not present

## 2024-01-07 DIAGNOSIS — N1832 Chronic kidney disease, stage 3b: Secondary | ICD-10-CM | POA: Diagnosis not present

## 2024-01-07 DIAGNOSIS — E1121 Type 2 diabetes mellitus with diabetic nephropathy: Secondary | ICD-10-CM | POA: Insufficient documentation

## 2024-01-07 DIAGNOSIS — N1831 Chronic kidney disease, stage 3a: Secondary | ICD-10-CM | POA: Diagnosis not present

## 2024-01-07 DIAGNOSIS — E66812 Obesity, class 2: Secondary | ICD-10-CM

## 2024-01-07 DIAGNOSIS — E1159 Type 2 diabetes mellitus with other circulatory complications: Secondary | ICD-10-CM

## 2024-01-07 MED ORDER — DAPAGLIFLOZIN PROPANEDIOL 5 MG PO TABS: 10.0000 mg | ORAL_TABLET | Freq: Every day | ORAL | 3 refills | Status: DC

## 2024-01-07 MED ORDER — DAPAGLIFLOZIN PROPANEDIOL 5 MG PO TABS: 10.0000 mg | ORAL_TABLET | Freq: Every day | ORAL | 3 refills | Status: AC

## 2024-01-07 MED ORDER — TIRZEPATIDE 10 MG/0.5ML ~~LOC~~ SOAJ
10.0000 mg | SUBCUTANEOUS | 0 refills | Status: AC
Start: 2024-01-07 — End: ?

## 2024-01-07 NOTE — Addendum Note (Signed)
 Addended by: EUGENIE ULANDA CROME on: 01/07/2024 02:48 PM   Modules accepted: Orders

## 2024-01-07 NOTE — Telephone Encounter (Signed)
 Noted

## 2024-01-07 NOTE — Progress Notes (Signed)
 Assessment & Plan   Assessment/Plan:    Assessment & Plan Type 2 diabetes mellitus Previously well-controlled with an A1c of 6.7. Current treatment includes tirzepatide  7.5 mg weekly and dapagliflozin  5 mg daily. Weight loss of approximately 25 pounds noted, anticipated that A1c remains in range due to weight loss and previous control. - Order hemoglobin A1c test - Increase tirzepatide  to 10 mg weekly - Ensure dapagliflozin  10 mg daily is prescribed - Encourage continuation of low carb diet and exercise  Chronic kidney disease stage 3b Requires regular monitoring. Recent changes in blood pressure medication by nephrologist, switching from losartan -hydrochlorothiazide  to losartan  alone. - Order basic metabolic panel to monitor kidney function  Hypertension Previously borderline with a blood pressure of 104/68. Current blood pressure is 134/89, slightly above goal of 130/80. Recent medication adjustment by nephrologist to losartan  alone. - Monitor blood pressure at home - Ensure losartan  prescription is current  Hyperlipidemia Managed with rosuvastatin  20 mg daily.  Obesity BMI of 35. Notable weight loss of approximately 25 pounds since last visit, indicating positive progress. - Encourage continuation of low carb diet and exercise  Onychomycosis of toenails Primarily affecting the big toes. Diabetics are at increased risk for nail fungus, requiring regular monitoring and management. - Refer to podiatry for nail management and treatment      Medications Discontinued During This Encounter  Medication Reason   tirzepatide  (MOUNJARO ) 10 MG/0.5ML Pen    losartan -hydrochlorothiazide  (HYZAAR) 100-25 MG tablet    dapagliflozin  propanediol (FARXIGA ) 5 MG TABS tablet     Return in about 3 months (around 04/08/2024) for BP, DM, HLD, fasting labs.        Subjective:   Encounter date: 01/07/2024  Bailey Ward is a 58 y.o. female who has Hypertension associated with  diabetes (HCC); Hair loss; Encounter for general adult medical examination with abnormal findings; Obesity; Vitamin D  deficiency; Chronic pain of left knee; Idiopathic chronic venous hypertension of both lower extremities with inflammation; S/P hysterectomy; Hyperpigmentation; Eczema; Hyperlipidemia associated with type 2 diabetes mellitus (HCC); Type 2 diabetes mellitus with stage 3a chronic kidney disease, without long-term current use of insulin (HCC); Ankle swelling; Discoloration and thickening of nails both feet; Hypokalemia; Chronic kidney disease (CKD) stage G3b/A2, moderately decreased glomerular filtration rate (GFR) between 30-44 mL/min/1.73 square meter and albuminuria creatinine ratio between 30-299 mg/g (HCC); Type 2 diabetes mellitus with stage 3b chronic kidney disease, without long-term current use of insulin (HCC); Diabetic nephropathy with proteinuria (HCC); and Onychomycosis on their problem list..   She  has a past medical history of Allergy and Hypertension..   She presents with chief complaint of Medical Management of Chronic Issues (Pt presents for a 3 mon f/u for her DM & BP check. No specific questions or concerns /) .   Discussed the use of AI scribe software for clinical note transcription with the patient, who gave verbal consent to proceed.  History of Present Illness Bailey Ward is a 58 year old female with diabetes who presents for a diabetic follow-up.  Glycemic control and weight loss - Type 2 diabetes mellitus managed with Mounjaro  7.5 mg injection weekly and dapagliflozin  10 mg daily (recently increased from 5 mg; currently taking two 5 mg tablets daily until supply runs out) - Last hemoglobin A1c was 6.7% - Approximately 25 pound weight loss since last visit - BMI is 39.5 - No new symptoms or concerns - Feels well overall  Hypertension management - Hypertension previously managed with losartan -hydrochlorothiazide  100-25 mg daily -  Recently switched  to losartan  alone (described as a blue pill) by nephrologist - Monitors blood pressure at home  Chronic kidney disease and electrolyte management - Chronic kidney disease stage 3B - Hypokalemia managed with potassium chloride  20 mEq daily  Hyperlipidemia - Hyperlipidemia managed with rosuvastatin  20 mg daily  Physical activity - Engages in physical activity, such as walking, as the weather has cooled  Onychomycosis - History of onychomycosis, particularly affecting the big toes - Findings attributed to fungal infection as noted by nail technician     ROS  Past Surgical History:  Procedure Laterality Date   ABDOMINAL HYSTERECTOMY      Outpatient Medications Prior to Visit  Medication Sig Dispense Refill   losartan  (COZAAR ) 50 MG tablet Take 50 mg by mouth daily.     potassium chloride  SA (KLOR-CON  M) 20 MEQ tablet Take 1 tablet (20 mEq total) by mouth daily. 90 tablet 3   rosuvastatin  (CRESTOR ) 20 MG tablet TAKE 1 TABLET BY MOUTH EVERY DAY 90 tablet 3   dapagliflozin  propanediol (FARXIGA ) 5 MG TABS tablet Take 1 tablet (5 mg total) by mouth daily before breakfast. 90 tablet 3   losartan -hydrochlorothiazide  (HYZAAR) 100-25 MG tablet TAKE 1 TABLET BY MOUTH EVERY DAY (Patient not taking: Reported on 01/07/2024) 90 tablet 3   tirzepatide  (MOUNJARO ) 10 MG/0.5ML Pen Inject 10 mg into the skin once a week. 6 mL 0   No facility-administered medications prior to visit.    Family History  Problem Relation Age of Onset   Hypertension Mother    Diabetes Father    Hypertension Father     Social History   Socioeconomic History   Marital status: Married    Spouse name: Not on file   Number of children: 2   Years of education: 49   Highest education level: Not on file  Occupational History   Occupation: Annuity Specialist  Tobacco Use   Smoking status: Never   Smokeless tobacco: Never  Vaping Use   Vaping status: Never Used  Substance and Sexual Activity   Alcohol use: No    Drug use: No   Sexual activity: Yes  Other Topics Concern   Not on file  Social History Narrative   Born in [place], [place] and raised in [place]. Fun: Travel, go to [employer], watch her daughter play soccer, watch movies   Denies religious beliefs that would effect health care.    Denies abuse and feels safe at home.    Social Drivers of Corporate investment banker Strain: Not on file  Food Insecurity: Not on file  Transportation Needs: Not on file  Physical Activity: Not on file  Stress: Not on file  Social Connections: Not on file  Intimate Partner Violence: Not on file                                                                                                  Objective:  Physical Exam: BP 134/89 (BP Location: Left Arm, Patient Position: Sitting, Cuff Size: Large)   Pulse 79   Temp (!) 96.6 F (35.9 C) (Temporal)  Ht 5' 3 (1.6 m)   Wt 198 lb 12.8 oz (90.2 kg)   SpO2 99%   BMI 35.22 kg/m   Wt Readings from Last 3 Encounters:  01/07/24 198 lb 12.8 oz (90.2 kg)  10/01/23 223 lb (101.2 kg)  09/08/23 225 lb 3.2 oz (102.2 kg)    Physical Exam VITALS: BP- 134/89 MEASUREMENTS: Weight- 198, BMI- 39.5. GENERAL: Alert, cooperative, well developed, no acute distress. HEENT: Normocephalic, normal oropharynx, moist mucous membranes. CHEST: Clear to auscultation bilaterally, no wheezes, rhonchi, or crackles. CARDIOVASCULAR: Normal heart rate and rhythm, S1 and S2 normal without murmurs. ABDOMEN: Soft, non-tender, non-distended, without organomegaly, normal bowel sounds. EXTREMITIES: Good pulses, no skin breakdown, no cyanosis or edema. NEUROLOGICAL: Cranial nerves grossly intact, moves all extremities without gross motor or sensory deficit, feet with good sensation and blood flow. SKIN: Nail thickening and yellowing.   Physical Exam  US  RENAL Result Date: 12/06/2023 CLINICAL DATA:  Initial evaluation for chronic kidney disease, stage III B EXAM: RENAL / URINARY TRACT  ULTRASOUND COMPLETE COMPARISON:  None Available. FINDINGS: Right Kidney: Renal measurements: 8.9 x 2.7 x 3.0 cm = volume: 38.0 mL. Increased echogenicity within the renal parenchyma with poor corticomedullary differentiation. No nephrolithiasis or hydronephrosis. No focal renal mass. Right kidney is somewhat low lying within the abdomen. Left Kidney: Renal measurements: 10.0 x 4.2 x 4.1 cm = volume: 90.1 mL. Increased echogenicity within the renal parenchyma. No nephrolithiasis or hydronephrosis. No focal renal mass. Bladder: Appears normal for degree of bladder distention. Other: None. IMPRESSION: 1. Increased echogenicity within the renal parenchyma, compatible with medical renal disease. 2. Asymmetric right renal atrophy. 3. No hydronephrosis. Electronically Signed   By: Morene Hoard M.D.   On: 12/06/2023 21:54    No results found for this or any previous visit (from the past 2160 hours).      Beverley Adine Hummer, MD, MS

## 2024-01-08 LAB — HEMOGLOBIN A1C: Hgb A1c MFr Bld: 6.1 % (ref 4.6–6.5)

## 2024-01-08 LAB — BASIC METABOLIC PANEL WITH GFR
BUN: 16 mg/dL (ref 6–23)
CO2: 23 meq/L (ref 19–32)
Calcium: 10.2 mg/dL (ref 8.4–10.5)
Chloride: 104 meq/L (ref 96–112)
Creatinine, Ser: 1.43 mg/dL — ABNORMAL HIGH (ref 0.40–1.20)
GFR: 40.58 mL/min — ABNORMAL LOW (ref >60.00)
Glucose, Bld: 88 mg/dL (ref 70–99)
Potassium: 4.3 meq/L (ref 3.5–5.1)
Sodium: 140 meq/L (ref 135–145)

## 2024-01-12 ENCOUNTER — Ambulatory Visit: Payer: Self-pay | Admitting: Family Medicine

## 2024-01-19 ENCOUNTER — Encounter: Payer: Self-pay | Admitting: Family Medicine

## 2024-02-12 ENCOUNTER — Ambulatory Visit: Admitting: Nurse Practitioner

## 2024-02-12 ENCOUNTER — Encounter: Payer: Self-pay | Admitting: Nurse Practitioner

## 2024-02-12 ENCOUNTER — Ambulatory Visit

## 2024-02-12 VITALS — BP 136/84 | HR 78 | Temp 97.5°F | Ht 63.0 in | Wt 196.0 lb

## 2024-02-12 DIAGNOSIS — J0140 Acute pansinusitis, unspecified: Secondary | ICD-10-CM

## 2024-02-12 MED ORDER — FLUTICASONE PROPIONATE 50 MCG/ACT NA SUSP: 2.0000 | Freq: Every day | NASAL | 1 refills | Status: AC

## 2024-02-12 MED ORDER — CETIRIZINE HCL 10 MG PO TABS: 10.0000 mg | ORAL_TABLET | Freq: Every day | ORAL | Status: AC

## 2024-02-12 MED ORDER — AMOXICILLIN-POT CLAVULANATE 875-125 MG PO TABS: 1.0000 | ORAL_TABLET | Freq: Two times a day (BID) | ORAL | 0 refills | Status: AC

## 2024-02-12 NOTE — Patient Instructions (Signed)

## 2024-02-12 NOTE — Progress Notes (Signed)
 Acute Office Visit  Subjective:    Patient ID: Bailey Ward, female    DOB: [birth year], 58 y.o.   MRN: [id number]  Chief Complaint  Patient presents with   Cough    Started a week ago with sinus drainage triggered by allergies, 3-4 days later started OTC cold & sinus medication mucus was green-yellowish with some blood     Cough This is a new problem. The current episode started 1 to 4 weeks ago. The problem has been waxing and waning. The problem occurs constantly. The cough is Productive of sputum and productive of purulent sputum. Associated symptoms include chills, headaches, nasal congestion, postnasal drip and rhinorrhea. Pertinent negatives include no chest pain, ear congestion, ear pain, fever, heartburn, hemoptysis, myalgias, rash, sore throat, shortness of breath, sweats, weight loss or wheezing. The symptoms are aggravated by cold air. She has tried prescription cough suppressant (oral decongestant) for the symptoms. The treatment provided no relief. Her past medical history is significant for environmental allergies. There is no history of asthma, bronchiectasis, bronchitis, emphysema or pneumonia.  Reports she unable to tolerate guafeneisin  Outpatient Medications Prior to Visit  Medication Sig   dapagliflozin  propanediol (FARXIGA ) 5 MG TABS tablet Take 2 tablets (10 mg total) by mouth daily before breakfast.   losartan  (COZAAR ) 50 MG tablet Take 50 mg by mouth daily.   potassium chloride  SA (KLOR-CON  M) 20 MEQ tablet Take 1 tablet (20 mEq total) by mouth daily.   rosuvastatin  (CRESTOR ) 20 MG tablet TAKE 1 TABLET BY MOUTH EVERY DAY   tirzepatide  (MOUNJARO ) 10 MG/0.5ML Pen Inject 10 mg into the skin once a week.   No facility-administered medications prior to visit.    Reviewed past medical and social history.  Review of Systems  Constitutional:  Positive for chills. Negative for fever and weight loss.  HENT:  Positive for postnasal drip and rhinorrhea. Negative  for ear pain and sore throat.   Respiratory:  Positive for cough. Negative for hemoptysis, shortness of breath and wheezing.   Cardiovascular:  Negative for chest pain.  Gastrointestinal:  Negative for heartburn.  Musculoskeletal:  Negative for myalgias.  Skin:  Negative for rash.  Allergic/Immunologic: Positive for environmental allergies.  Neurological:  Positive for headaches.   Per HPI     Objective:    Physical Exam Vitals and nursing note reviewed.  Constitutional:      General: She is not in acute distress. HENT:     Right Ear: Tympanic membrane, ear canal and external ear normal.     Left Ear: Tympanic membrane, ear canal and external ear normal.     Nose: Congestion and rhinorrhea present. No nasal tenderness or mucosal edema.     Right Nostril: No occlusion.     Left Nostril: No occlusion.     Right Turbinates: Not enlarged, swollen or pale.     Left Turbinates: Not enlarged, swollen or pale.     Right Sinus: No maxillary sinus tenderness or frontal sinus tenderness.     Left Sinus: No maxillary sinus tenderness or frontal sinus tenderness.     Mouth/Throat:     Pharynx: Uvula midline.     Tonsils: No tonsillar exudate or tonsillar abscesses.  Cardiovascular:     Rate and Rhythm: Normal rate and regular rhythm.     Pulses: Normal pulses.     Heart sounds: Normal heart sounds.  Pulmonary:     Effort: Pulmonary effort is normal.     Breath sounds: Normal breath  sounds.  Musculoskeletal:     Cervical back: Normal range of motion and neck supple.  Lymphadenopathy:     Cervical: No cervical adenopathy.  Neurological:     Mental Status: She is alert and oriented to person, place, and time.    BP 136/84 (BP Location: Left Arm, Patient Position: Sitting, Cuff Size: Large)   Pulse 78   Temp (!) 97.5 F (36.4 C) (Oral)   Ht 5' 3 (1.6 m)   Wt 196 lb (88.9 kg)   SpO2 99%   BMI 34.72 kg/m    No results found for any visits on 02/12/24.     Assessment & Plan:    Problem List Items Addressed This Visit   None Visit Diagnoses       Acute non-recurrent pansinusitis    -  Primary   Relevant Medications   amoxicillin -clavulanate (AUGMENTIN) 875-125 MG tablet   cetirizine (ZYRTEC) 10 MG tablet   fluticasone (FLONASE) 50 MCG/ACT nasal spray      Meds ordered this encounter  Medications   amoxicillin -clavulanate (AUGMENTIN) 875-125 MG tablet    Sig: Take 1 tablet by mouth 2 (two) times daily.    Dispense:  14 tablet    Refill:  0    Supervising Provider:   BERNETA FALLOW ALFRED [[id number]]   cetirizine (ZYRTEC) 10 MG tablet    Sig: Take 1 tablet (10 mg total) by mouth daily.    Supervising Provider:   BERNETA FALLOW ALFRED [[id number]]   fluticasone (FLONASE) 50 MCG/ACT nasal spray    Sig: Place 2 sprays into both nostrils daily.    Dispense:  16 g    Refill:  1    Supervising Provider:   BERNETA FALLOW SAYRE [[id number]]   Return if symptoms worsen or fail to improve.    Roselie Mood, NP

## 2024-03-06 ENCOUNTER — Other Ambulatory Visit: Payer: Self-pay | Admitting: Nurse Practitioner

## 2024-03-06 DIAGNOSIS — J0140 Acute pansinusitis, unspecified: Secondary | ICD-10-CM

## 2024-03-10 NOTE — Telephone Encounter (Signed)
 Change request received for Flonase  spray. FOV:05/03/2024 LOV:02/12/2024 Last refill:02/12/2024 Medication has been denied. Flonase  comes in a 15.20mL bottle. It contains 120 metered sprays at 50mcg per spray. 1 bottle would be enough for 60 days. Pts script was written for 48mL. That is equal to 3 bottles. This script would give pt 180 days of nasal spray and it had an additional refill. No need to change request to 90 days, as what was sent in is sufficient.

## 2024-03-11 DIAGNOSIS — E1122 Type 2 diabetes mellitus with diabetic chronic kidney disease: Secondary | ICD-10-CM | POA: Diagnosis not present

## 2024-03-11 DIAGNOSIS — N1832 Chronic kidney disease, stage 3b: Secondary | ICD-10-CM | POA: Diagnosis not present

## 2024-03-11 DIAGNOSIS — E876 Hypokalemia: Secondary | ICD-10-CM | POA: Diagnosis not present

## 2024-03-11 DIAGNOSIS — I129 Hypertensive chronic kidney disease with stage 1 through stage 4 chronic kidney disease, or unspecified chronic kidney disease: Secondary | ICD-10-CM | POA: Diagnosis not present

## 2024-03-16 ENCOUNTER — Other Ambulatory Visit: Payer: Self-pay | Admitting: Nephrology

## 2024-03-16 DIAGNOSIS — N1832 Chronic kidney disease, stage 3b: Secondary | ICD-10-CM

## 2024-03-19 ENCOUNTER — Encounter: Payer: Self-pay | Admitting: Nephrology

## 2024-03-19 ENCOUNTER — Ambulatory Visit
Admission: RE | Admit: 2024-03-19 | Discharge: 2024-03-19 | Disposition: A | Source: Ambulatory Visit | Attending: Nephrology

## 2024-03-19 DIAGNOSIS — N1832 Chronic kidney disease, stage 3b: Secondary | ICD-10-CM

## 2024-03-22 DIAGNOSIS — Z1231 Encounter for screening mammogram for malignant neoplasm of breast: Secondary | ICD-10-CM | POA: Diagnosis not present

## 2024-03-22 DIAGNOSIS — Z124 Encounter for screening for malignant neoplasm of cervix: Secondary | ICD-10-CM | POA: Diagnosis not present

## 2024-03-22 DIAGNOSIS — Z01419 Encounter for gynecological examination (general) (routine) without abnormal findings: Secondary | ICD-10-CM | POA: Diagnosis not present

## 2024-03-22 LAB — HM MAMMOGRAPHY

## 2024-03-25 ENCOUNTER — Telehealth: Payer: Self-pay | Admitting: Family Medicine

## 2024-03-25 NOTE — Telephone Encounter (Signed)
 error

## 2024-04-09 ENCOUNTER — Ambulatory Visit: Admitting: Family Medicine

## 2024-05-10 ENCOUNTER — Ambulatory Visit: Admitting: Family Medicine

## 2024-06-02 ENCOUNTER — Ambulatory Visit: Admitting: Family Medicine
